# Patient Record
Sex: Female | Born: 1955 | Race: White | Hispanic: No | Marital: Married | Smoking: Current every day smoker
Health system: Southern US, Community
[De-identification: ages and names within clinical notes are randomized; demographics above are authoritative.]

## PROBLEM LIST (undated history)

## (undated) HISTORY — PX: OTHER SURGICAL HISTORY: SHX169

## (undated) HISTORY — PX: BREAST SURGERY: SHX581

## (undated) HISTORY — PX: TOTAL ABDOMINAL HYSTERECTOMY W/ BILATERAL SALPINGOOPHORECTOMY: SHX83

## (undated) HISTORY — PX: FOOT SURGERY: SHX648

## (undated) HISTORY — PX: GANGLION CYST EXCISION: SHX1691

## (undated) HISTORY — PX: CARPAL TUNNEL RELEASE: SHX101

---

## 1979-11-17 HISTORY — PX: TUBAL LIGATION: SHX77

## 2010-12-18 HISTORY — PX: BACK SURGERY: SHX140

## 2011-04-16 ENCOUNTER — Ambulatory Visit (INDEPENDENT_AMBULATORY_CARE_PROVIDER_SITE_OTHER): Payer: PRIVATE HEALTH INSURANCE | Admitting: Family Medicine

## 2011-04-16 ENCOUNTER — Encounter: Payer: Self-pay | Admitting: Family Medicine

## 2011-04-16 DIAGNOSIS — G4700 Insomnia, unspecified: Secondary | ICD-10-CM

## 2011-04-16 DIAGNOSIS — F17200 Nicotine dependence, unspecified, uncomplicated: Secondary | ICD-10-CM

## 2011-04-16 DIAGNOSIS — M545 Low back pain: Secondary | ICD-10-CM | POA: Insufficient documentation

## 2011-04-16 DIAGNOSIS — G8929 Other chronic pain: Secondary | ICD-10-CM

## 2011-04-16 DIAGNOSIS — F329 Major depressive disorder, single episode, unspecified: Secondary | ICD-10-CM

## 2011-04-16 DIAGNOSIS — Z23 Encounter for immunization: Secondary | ICD-10-CM

## 2011-04-16 DIAGNOSIS — M797 Fibromyalgia: Secondary | ICD-10-CM | POA: Insufficient documentation

## 2011-04-16 DIAGNOSIS — IMO0001 Reserved for inherently not codable concepts without codable children: Secondary | ICD-10-CM

## 2011-04-16 DIAGNOSIS — F32A Depression, unspecified: Secondary | ICD-10-CM

## 2011-04-16 DIAGNOSIS — Z720 Tobacco use: Secondary | ICD-10-CM

## 2011-04-16 NOTE — Assessment & Plan Note (Signed)
She is not interested in cessation at this time.

## 2011-04-16 NOTE — Assessment & Plan Note (Signed)
She cas call when she is due for hre tramadol. Would like to get her old records.

## 2011-04-16 NOTE — Assessment & Plan Note (Signed)
Well-controlled on current regimen. ?

## 2011-04-16 NOTE — Progress Notes (Signed)
Subjective:    Patient ID: Michelle Rodgers, female    DOB: 13-Jul-1956, 55 y.o.   MRN: [id number]  HPI Here to establish care.  Relocated her last fall. REcently had back surgery with Dr. Winferd Humphrey at [facility]. She is doing much better and has actually come off her narcotics int the last month.  She does have her tramadol for prn use for her firbromyalgia but she says she tends to do better in the summer so she is hoping she won't need it anytime soon.    She feels her mood is well controlled on her current regimen.  She doesn't need refills right now.    Review of Systems  Constitutional: Negative for fever, diaphoresis and unexpected weight change.  HENT: Negative for hearing loss, rhinorrhea and tinnitus.   Eyes: Negative for visual disturbance.  Respiratory: Negative for cough and wheezing.   Cardiovascular: Negative for chest pain and palpitations.  Gastrointestinal: Negative for nausea, vomiting, diarrhea and blood in stool.  Genitourinary: Negative for vaginal bleeding, vaginal discharge and difficulty urinating.  Musculoskeletal: Negative for myalgias and arthralgias.  Skin: Negative for rash.  Neurological: Negative for headaches.  Hematological: Negative for adenopathy. Does not bruise/bleed easily.  Psychiatric/Behavioral: Negative for sleep disturbance and dysphoric mood. The patient is not nervous/anxious.     BP 122/75  Pulse 86  Ht 5\' 4"  (1.626 m)  Wt 282 lb (127.914 kg)  BMI 48.41 kg/m2    No Known Allergies  History reviewed. No pertinent past medical history.  Past Surgical History  Procedure Date  . Cesarean section 1978  . Tubal ligation 1981  . Hysterec   . Total abdominal hysterectomy w/ bilateral salpingoophorectomy   . Carpal tunnel release     Left and right  . Foot surgery     Right  . Breast surgery     Left   . Ganglion cyst excision     Left  . Back surgery 12/18/2010  . Pilonidal cyst     History   Social History  . Marital Status: Married      Spouse Name: Marcy Salvo    Number of Children: 1   . Years of Education: N/A   Occupational History  . Disabled.     Social History Main Topics  . Smoking status: Current Everyday Smoker -- 1.0 packs/day for 30 years    Types: Cigarettes  . Smokeless tobacco: Not on file  . Alcohol Use: No  . Drug Use: No  . Sexually Active: Not on file   Other Topics Concern  . Not on file   Social History Narrative   Disabled for her back pain and her fibromyalgia.  GED. 1 caffeinated drink per day.     Family History  Problem Relation Age of Onset  . Heart attack Mother 51    Bypass, stents  . Diabetes Mother   . Hypertension Mother   . COPD Mother     Current outpatient prescriptions:escitalopram (LEXAPRO) 20 MG tablet, Take 20 mg by mouth daily.  , Disp: , Rfl: ;  oxyCODONE-acetaminophen (PERCOCET) 10-325 MG per tablet, Take 1 tablet by mouth every 4 (four) hours as needed.  , Disp: , Rfl: ;  pregabalin (LYRICA) 75 MG capsule, Take 75 mg by mouth 3 (three) times daily.  , Disp: , Rfl: ;  traZODone (DESYREL) 100 MG tablet, Take 100 mg by mouth. Take two at bedtime  , Disp: , Rfl:  Venlafaxine HCl 37.5 MG TB24, Take by mouth 2 (two)  times daily.  , Disp: , Rfl:      Objective:   Physical Exam  Constitutional: She is oriented to person, place, and time. She appears well-developed.  HENT:  Head: Normocephalic and atraumatic.  Right Ear: External ear normal.  Left Ear: External ear normal.  Eyes: Conjunctivae are normal.  Neck: Neck supple. No thyromegaly present.  Cardiovascular: Regular rhythm and normal heart sounds.        No carotid bruits.   Pulmonary/Chest: Effort normal and breath sounds normal.  Musculoskeletal: She exhibits no edema.  Lymphadenopathy:    She has no cervical adenopathy.  Neurological: She is alert and oriented to person, place, and time.  Skin: Skin is warm and dry.  Psychiatric: She has a normal mood and affect.          Assessment & Plan:   Discussed vaccines. She is overdue for Tdap. Given today.

## 2011-04-17 ENCOUNTER — Telehealth: Payer: Self-pay | Admitting: *Deleted

## 2011-04-17 MED ORDER — TRAMADOL HCL 50 MG PO TABS: 50.0000 mg | ORAL_TABLET | Freq: Two times a day (BID) | ORAL | Status: DC | PRN

## 2011-04-17 NOTE — Telephone Encounter (Signed)
Pt called and wants a refill of her tramadol.she was a new pt that was seen yesterday

## 2011-05-12 ENCOUNTER — Other Ambulatory Visit: Payer: Self-pay | Admitting: Family Medicine

## 2011-05-12 MED ORDER — PREGABALIN 75 MG PO CAPS: 75.0000 mg | ORAL_CAPSULE | Freq: Three times a day (TID) | ORAL | Status: DC

## 2011-05-13 NOTE — Telephone Encounter (Signed)
Closed

## 2011-05-14 ENCOUNTER — Ambulatory Visit (INDEPENDENT_AMBULATORY_CARE_PROVIDER_SITE_OTHER): Payer: PRIVATE HEALTH INSURANCE | Admitting: Family Medicine

## 2011-05-14 ENCOUNTER — Encounter: Payer: Self-pay | Admitting: Family Medicine

## 2011-05-14 VITALS — BP 137/93 | HR 101 | Temp 98.3°F | Ht 65.0 in | Wt 281.0 lb

## 2011-05-14 DIAGNOSIS — IMO0001 Reserved for inherently not codable concepts without codable children: Secondary | ICD-10-CM

## 2011-05-14 DIAGNOSIS — G8929 Other chronic pain: Secondary | ICD-10-CM

## 2011-05-14 DIAGNOSIS — R109 Unspecified abdominal pain: Secondary | ICD-10-CM

## 2011-05-14 DIAGNOSIS — R350 Frequency of micturition: Secondary | ICD-10-CM

## 2011-05-14 LAB — POCT URINALYSIS DIPSTICK
Glucose, UA: NEGATIVE
Nitrite, UA: NEGATIVE

## 2011-05-14 MED ORDER — CARISOPRODOL 350 MG PO TABS: 350.0000 mg | ORAL_TABLET | Freq: Three times a day (TID) | ORAL | Status: AC | PRN

## 2011-05-14 MED ORDER — IBUPROFEN 800 MG PO TABS: 800.0000 mg | ORAL_TABLET | Freq: Three times a day (TID) | ORAL | Status: AC | PRN

## 2011-05-14 NOTE — Patient Instructions (Addendum)
Will call you with urine culture result on Monday and will treat only if + for infection.  Will change your Flexeril to [facility] which you can take 3 x a day for muscle spasm.    Use RX Ibuprofen 800 mg 3 x a day with food for pain.  Return for f/u chronic pain with Dr Linford Arnold in 3 wks.

## 2011-05-14 NOTE — Assessment & Plan Note (Signed)
UA is neg today, so this is likely MDK

## 2011-05-14 NOTE — Progress Notes (Signed)
  Subjective:    Patient ID: Michelle Rodgers, female    DOB: 03-01-1956, 55 y.o.   MRN: [id number]  HPI  55 yo WF presents for pain in the L flank that started last wk.  She saw her neurosurgeon and was given methylprednisolone but it did not help.  She has chronic pain issues.  She has nocturia but no increased frequency during the day.  No dysuria.  She denies any suprapubic pain.  She drinks a lot of fluid during the night.  Denies hematuria.    She sees Dr Winferd Humphrey for her herniated discs.  She is requesting to restart chronic narcotics for her pain.  BP 137/93  Pulse 101  Temp(Src) 98.3 F (36.8 C) (Oral)  Ht 5\' 5"  (1.651 m)  Wt 281 lb (127.461 kg)  BMI 46.76 kg/m2    Review of Systems  Constitutional: Negative for fatigue.  Gastrointestinal: Negative for abdominal pain.  Genitourinary: Positive for frequency and enuresis. Negative for dysuria, urgency and hematuria.  Musculoskeletal: Positive for myalgias, back pain and arthralgias. Negative for joint swelling.       Objective:   Physical Exam  Constitutional: She appears well-developed and well-nourished.       Obese, in NAD  HENT:  Mouth/Throat: Oropharynx is clear and moist.  Eyes: No scleral icterus.  Neck: Neck supple.  Cardiovascular: Normal rate, regular rhythm and normal heart sounds.   Pulmonary/Chest: Effort normal and breath sounds normal. No respiratory distress.  Abdominal: Soft. There is no tenderness.  Musculoskeletal: She exhibits no edema.       Tender over L rhomboids  Skin: Skin is warm and dry.  Psychiatric: She has a normal mood and affect.          Assessment & Plan:  Flank Pain- likely MSK.  UA neg today but will send for cx.    Chronic Pain-. Dr Linford Arnold is her PCP and I reviewed her last OV note which did not indicate a plan to RF her narcotics.  I explained that she may need to see pain management.  She will make a f/u in 3 wks.

## 2011-05-17 LAB — URINE CULTURE

## 2011-05-18 ENCOUNTER — Telehealth: Payer: Self-pay | Admitting: Family Medicine

## 2011-05-18 NOTE — Telephone Encounter (Signed)
Pls let pt know that her urine culture came back negative indicating no UTI.  Her flank pain was likely to be musculoskeletal.

## 2011-05-18 NOTE — Telephone Encounter (Signed)
Pt advised of results and rec. 

## 2011-06-05 ENCOUNTER — Ambulatory Visit (INDEPENDENT_AMBULATORY_CARE_PROVIDER_SITE_OTHER): Payer: PRIVATE HEALTH INSURANCE | Admitting: Family Medicine

## 2011-06-05 ENCOUNTER — Encounter: Payer: Self-pay | Admitting: Family Medicine

## 2011-06-05 DIAGNOSIS — M549 Dorsalgia, unspecified: Secondary | ICD-10-CM

## 2011-06-05 DIAGNOSIS — R030 Elevated blood-pressure reading, without diagnosis of hypertension: Secondary | ICD-10-CM

## 2011-06-05 MED ORDER — OXYCODONE-ACETAMINOPHEN 10-325 MG PO TABS: 1.0000 | ORAL_TABLET | Freq: Three times a day (TID) | ORAL | Status: DC | PRN

## 2011-06-05 NOTE — Patient Instructions (Signed)
Blood pressure check in a few weeks.

## 2011-06-05 NOTE — Progress Notes (Signed)
  Subjective:    Patient ID: Britiney Blahnik, female    DOB: [birth year], 55 y.o.   MRN: [id number]  HPI Called her neurosurgeon for her back (put on prednisone) but that didn't help.  Saw Dr. Cathey Endow who gave her soma and NSAID but says that didn't really help.  She would like to restart her pecocet. She really wanted to wean off but still having significant pain. Having pain turning over in bed at night.  Also tried alave and no help. Has been doing pull exercises.   Also starting beginners yoga. Says initially after her back surgery she had not pain but over the last month has pain is back and she says it is just as painful before her surgery.   Has f/u with her neurosurgeon on August on 8th.     Review of Systems     Objective:   Physical Exam  Constitutional: She appears well-developed and well-nourished.  Cardiovascular: Normal rate, regular rhythm and normal heart sounds.   Pulmonary/Chest: Effort normal and breath sounds normal.  Musculoskeletal: She exhibits no edema.       Neg straight leg raise. Hip, knee, and ankle strength 5/5 bilat.    Skin: Skin is warm and dry.  Psychiatric: She has a normal mood and affect.          Assessment & Plan:  BAck pain - chronic, discussed that I will give her 40 tabs until hre appt in 2 weeks, but I need a note from her surgeon about what he recommends for her pain control for her back. Her former PCP wrote her pain meds. I Am OK to write. Will need a narcotic contract once a get letter or note from the surgeon.  Elevated BP- has never been high before. Recheck in a couple of weeks. She feels it is from the pain.

## 2011-06-15 ENCOUNTER — Other Ambulatory Visit: Payer: Self-pay | Admitting: *Deleted

## 2011-06-15 MED ORDER — PREGABALIN 75 MG PO CAPS: 75.0000 mg | ORAL_CAPSULE | Freq: Three times a day (TID) | ORAL | Status: DC

## 2011-07-02 ENCOUNTER — Encounter: Payer: Self-pay | Admitting: Family Medicine

## 2011-07-02 ENCOUNTER — Ambulatory Visit (INDEPENDENT_AMBULATORY_CARE_PROVIDER_SITE_OTHER): Payer: PRIVATE HEALTH INSURANCE | Admitting: Family Medicine

## 2011-07-02 DIAGNOSIS — M25562 Pain in left knee: Secondary | ICD-10-CM

## 2011-07-02 DIAGNOSIS — N281 Cyst of kidney, acquired: Secondary | ICD-10-CM

## 2011-07-02 DIAGNOSIS — M25569 Pain in unspecified knee: Secondary | ICD-10-CM

## 2011-07-02 DIAGNOSIS — Q619 Cystic kidney disease, unspecified: Secondary | ICD-10-CM

## 2011-07-02 NOTE — Patient Instructions (Signed)
i will call you once a get a copy of your results from your MRI.

## 2011-07-02 NOTE — Progress Notes (Signed)
  Subjective:    Patient ID: Michelle Rodgers, female    DOB: Aug 26, 1956, 55 y.o.   MRN: [id number]  HPI Dr. Winferd Humphrey did an MRI (that is her ortho). She was seeing him bc hx of pior back surgery and was having recurrent pain in her low back. Saw 2 cysts in the left kidney.   Left lateral knee pain. When moves her knee it feels like a "slushy" . Pain for at least 6 weeks. Worse with getting up and down.  Not giving out.  No locking.  No sitffness.  Has been doing exercises in the pool.  After walking for a few minutes it eases off.    Review of Systems     Objective:   Physical Exam  Constitutional: She appears well-developed and well-nourished.  HENT:  Head: Normocephalic and atraumatic.  Cardiovascular: Normal rate, regular rhythm and normal heart sounds.   Pulmonary/Chest: Effort normal and breath sounds normal.  Musculoskeletal:       LT knee with NROM, no pain with extension or flexion.  No laxity of hte joint. No swelling. Non tender over the medal jont lines but tender at the base of the patella.    Skin: Skin is warm and dry.  Psychiatric: She has a normal mood and affect. Her behavior is normal.          Assessment & Plan:  LT lat knee pain - Consider iliotibial band syndrome. Given H.O. On exercises. Tx with NSAID prn.  Call if not better in about 3 week and will refer to ortho.   Kidney cysts - will need to get copy of report to see what type of cysts. Explained to pt thi helps determine the  Best course of action. I expalined that these should not be causing pain. If continue to have back pain consider PT>

## 2011-07-13 ENCOUNTER — Encounter: Payer: Self-pay | Admitting: Family Medicine

## 2011-07-14 ENCOUNTER — Telehealth: Payer: Self-pay | Admitting: Family Medicine

## 2011-07-14 DIAGNOSIS — N281 Cyst of kidney, acquired: Secondary | ICD-10-CM | POA: Insufficient documentation

## 2011-07-14 NOTE — Telephone Encounter (Signed)
Call pt: I got copy of MRI from [facility] on the 2 cysts on the Left kidney. These evidently were stable compared to an MRI done on 09/2010 which was about 9 months ago. This is good new that they haven't changed in almost a year. Recommend repeat US to eval in 1 year to make sure stable.

## 2011-07-15 NOTE — Telephone Encounter (Signed)
Left message on pt.'s vm.

## 2011-07-21 ENCOUNTER — Other Ambulatory Visit: Payer: Self-pay | Admitting: *Deleted

## 2011-07-21 MED ORDER — PREGABALIN 75 MG PO CAPS: 75.0000 mg | ORAL_CAPSULE | Freq: Three times a day (TID) | ORAL | Status: DC

## 2011-07-22 ENCOUNTER — Other Ambulatory Visit: Payer: Self-pay | Admitting: *Deleted

## 2011-07-22 MED ORDER — PREGABALIN 75 MG PO CAPS: 75.0000 mg | ORAL_CAPSULE | Freq: Three times a day (TID) | ORAL | Status: DC

## 2011-07-23 ENCOUNTER — Other Ambulatory Visit: Payer: Self-pay | Admitting: Family Medicine

## 2011-07-23 NOTE — Telephone Encounter (Signed)
[facility] calling and stating they needed hard copy script for Lyrica faxed to [phone]. Plan:  Called the pharmacy, and the hard script has been already taken care of. Jarvis Newcomer, LPN [facility]

## 2011-11-27 DIAGNOSIS — Z9889 Other specified postprocedural states: Secondary | ICD-10-CM | POA: Insufficient documentation

## 2012-01-09 HISTORY — PX: CERVICAL DISC SURGERY: SHX588

## 2012-01-24 ENCOUNTER — Encounter: Payer: Self-pay | Admitting: Family Medicine

## 2012-06-16 ENCOUNTER — Ambulatory Visit (INDEPENDENT_AMBULATORY_CARE_PROVIDER_SITE_OTHER): Payer: PRIVATE HEALTH INSURANCE

## 2012-06-16 ENCOUNTER — Ambulatory Visit (INDEPENDENT_AMBULATORY_CARE_PROVIDER_SITE_OTHER): Payer: PRIVATE HEALTH INSURANCE | Admitting: Family Medicine

## 2012-06-16 ENCOUNTER — Other Ambulatory Visit: Payer: Self-pay | Admitting: Family Medicine

## 2012-06-16 ENCOUNTER — Encounter: Payer: Self-pay | Admitting: Family Medicine

## 2012-06-16 VITALS — BP 144/79 | HR 84 | Temp 98.4°F | Ht 65.0 in | Wt 268.0 lb

## 2012-06-16 DIAGNOSIS — R030 Elevated blood-pressure reading, without diagnosis of hypertension: Secondary | ICD-10-CM

## 2012-06-16 DIAGNOSIS — M25569 Pain in unspecified knee: Secondary | ICD-10-CM

## 2012-06-16 DIAGNOSIS — M549 Dorsalgia, unspecified: Secondary | ICD-10-CM

## 2012-06-16 DIAGNOSIS — M25469 Effusion, unspecified knee: Secondary | ICD-10-CM

## 2012-06-16 DIAGNOSIS — M797 Fibromyalgia: Secondary | ICD-10-CM

## 2012-06-16 DIAGNOSIS — M1710 Unilateral primary osteoarthritis, unspecified knee: Secondary | ICD-10-CM

## 2012-06-16 DIAGNOSIS — M25562 Pain in left knee: Secondary | ICD-10-CM

## 2012-06-16 DIAGNOSIS — IMO0002 Reserved for concepts with insufficient information to code with codable children: Secondary | ICD-10-CM

## 2012-06-16 DIAGNOSIS — IMO0001 Reserved for inherently not codable concepts without codable children: Secondary | ICD-10-CM

## 2012-06-16 MED ORDER — HYDROCODONE-ACETAMINOPHEN 10-325 MG PO TABS: 1.0000 | ORAL_TABLET | Freq: Two times a day (BID) | ORAL | Status: AC | PRN

## 2012-06-16 NOTE — Progress Notes (Signed)
Subjective:    Patient ID: Michelle Rodgers, female    DOB: 12-Oct-1956, 56 y.o.   MRN: [id number]  HPI Still having pain in the left knee posterior lateral area.  Says was using pain meds for her back so now that she is off those she is noticing it more.  Worse when sits for awhile and then gets up.  No locking or popping or giving out.  No swelling. Has been taking 8 Aleve a day for pain control.  Had an xray of that knee about 5 years and told had some OA. She has had her back surgery since I last saw her. She has done well with this. The numbness that was chronic in her left leg is resolved but she does have some residual weakness. She said she did do physical therapy for this. Her follow with her surgeon is next month.   Review of Systems BP 144/79  Pulse 84  Temp 98.4 F (36.9 C) (Oral)  Ht 5\' 5"  (1.651 m)  Wt 268 lb (121.564 kg)  BMI 44.60 kg/m2  SpO2 97%    No Known Allergies  No past medical history on file.  Past Surgical History  Procedure Date  . Cesarean section 1978  . Tubal ligation 1981  . Hysterec   . Total abdominal hysterectomy w/ bilateral salpingoophorectomy   . Carpal tunnel release     Left and right  . Foot surgery     Right  . Breast surgery     Left   . Ganglion cyst excision     Left  . Back surgery 12/18/2010    L4-5 post lumbar interbody fusion by Dr. 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 Pilonidal cyst   . Cervical disc surgery 01/09/12    C4-C7    History   Social History  . Marital Status: Married    Spouse Name: Marcy Salvo    Number of Children: 1   . Years of Education: N/A   Occupational History  . Disabled.     Social History Main Topics  . Smoking status: Current Everyday Smoker -- 1.0 packs/day for 30 years    Types: Cigarettes  . Smokeless tobacco: Not on file  . Alcohol Use: No  . Drug Use: No  . Sexually Active: Not on file   Other Topics Concern  . Not on file   Social History Narrative   Disabled for her back  pain and her fibromyalgia.  GED. 1 caffeinated drink per day.     Family History  Problem Relation Age of Onset  . Heart attack Mother 32    Bypass, stents  . Diabetes Mother   . Hypertension Mother   . COPD Mother     Outpatient Encounter Prescriptions as of 06/16/2012  Medication Sig Dispense Refill  . escitalopram (LEXAPRO) 20 MG tablet Take 20 mg by mouth daily.        . traZODone (DESYREL) 100 MG tablet Take 200 mg by mouth at bedtime. Take two at bedtime       . Venlafaxine HCl 37.5 MG TB24 Take by mouth 2 (two) times daily.        Marland Kitchen HYDROcodone-acetaminophen (NORCO) 10-325 MG per tablet Take 1 tablet by mouth 2 (two) times daily as needed for pain.  60 tablet  0  . DISCONTD: carisoprodol (SOMA) 350 MG tablet Take 350 mg by mouth 3 (three) times daily as needed.        Marland Kitchen DISCONTD: oxyCODONE-acetaminophen (PERCOCET)  10-325 MG per tablet Take 1 tablet by mouth every 8 (eight) hours as needed.  40 tablet  0  . DISCONTD: pregabalin (LYRICA) 75 MG capsule Take 1 capsule (75 mg total) by mouth 3 (three) times daily.  90 capsule  0  . DISCONTD: traMADol (ULTRAM) 50 MG tablet Take 1 tablet (50 mg total) by mouth 2 (two) times daily between meals as needed.  60 tablet  0           Objective:   Physical Exam  Constitutional: She is oriented to person, place, and time. She appears well-developed and well-nourished.  Musculoskeletal:       LEFT knee with normal range of motion. No crepitus. She is nontender along the joint lines. She is tender over the posterior a lateral edge. No apparent swelling. No tenderness around the knee cap. Strength is 4/5 in the LEFT hip, knee and ankle. Strength is 5 out of 5 on the right leg. No increased laxity. No significant pain with varus or valgus stress.  Neurological: She is alert and oriented to person, place, and time.  Skin: Skin is warm and dry.  Psychiatric: She has a normal mood and affect. Her behavior is normal.          Assessment &  Plan:  Left knee pain - Will start with the xray. Since her pain has been present for well over a year. Initially when I saw her a year ago that was more of a tendinitis and work on stretches but she did not improve. She's had her back surgery since then so it should not be related to this. The she does have some chronic decreased strength in his leg that will likely not improve. She's even done physical therapy. Depending on the x-ray results consider repeat physical therapy versus referral to an orthopedist versus getting an MRI. She can continue Aleve as needed with food and water, but needs to dec her total tabs per day. Stop immediately if any GI irritation. I didn't send over a prescription for hydrocodone to use up to twice a day #60 tabs, thus can reduce her intake of Aleve.   Fibromyalgia/chronic pain-she's requesting referral to a pain clinic. She does still have some chronic back pain even if she's had surgery and has done well. She also has chronic pain for her fibromyalgia. I be happy to refer her. She does live here in [place].  Elevated BP - Had 2 cups of coffe and on NSAID, and smoker.  She says she checks her blood pressure frequently at home. Yesterday it ran 120/70.

## 2012-06-16 NOTE — Patient Instructions (Addendum)
Ice as needed Rest and elevated as needed Can use Aleve with food and water to avoid Stomach irritation.

## 2012-06-18 ENCOUNTER — Ambulatory Visit (HOSPITAL_BASED_OUTPATIENT_CLINIC_OR_DEPARTMENT_OTHER)
Admission: RE | Admit: 2012-06-18 | Discharge: 2012-06-18 | Disposition: A | Discharge status: Home / Self Care | Payer: PRIVATE HEALTH INSURANCE | Source: Ambulatory Visit | Attending: Family Medicine | Admitting: Family Medicine

## 2012-06-18 DIAGNOSIS — M1710 Unilateral primary osteoarthritis, unspecified knee: Secondary | ICD-10-CM | POA: Insufficient documentation

## 2012-06-18 DIAGNOSIS — IMO0002 Reserved for concepts with insufficient information to code with codable children: Secondary | ICD-10-CM | POA: Insufficient documentation

## 2012-06-18 DIAGNOSIS — E669 Obesity, unspecified: Secondary | ICD-10-CM | POA: Insufficient documentation

## 2012-06-18 DIAGNOSIS — M7120 Synovial cyst of popliteal space [Baker], unspecified knee: Secondary | ICD-10-CM | POA: Insufficient documentation

## 2012-06-18 DIAGNOSIS — X58XXXA Exposure to other specified factors, initial encounter: Secondary | ICD-10-CM | POA: Insufficient documentation

## 2012-06-18 DIAGNOSIS — M25569 Pain in unspecified knee: Secondary | ICD-10-CM | POA: Insufficient documentation

## 2012-06-18 DIAGNOSIS — M25469 Effusion, unspecified knee: Secondary | ICD-10-CM | POA: Insufficient documentation

## 2012-06-18 DIAGNOSIS — M25562 Pain in left knee: Secondary | ICD-10-CM

## 2012-06-21 ENCOUNTER — Telehealth: Payer: Self-pay | Admitting: *Deleted

## 2012-06-21 DIAGNOSIS — S83209A Unspecified tear of unspecified meniscus, current injury, unspecified knee, initial encounter: Secondary | ICD-10-CM

## 2012-06-21 DIAGNOSIS — M25569 Pain in unspecified knee: Secondary | ICD-10-CM

## 2012-06-21 NOTE — Telephone Encounter (Signed)
Referral placed.

## 2012-06-21 NOTE — Telephone Encounter (Signed)
Pt states the doctor she wants to see in regards to her xr is Kennon Holter at [facility]

## 2012-07-20 DIAGNOSIS — Z981 Arthrodesis status: Secondary | ICD-10-CM | POA: Insufficient documentation

## 2012-08-05 ENCOUNTER — Encounter: Payer: Self-pay | Admitting: Physician Assistant

## 2012-08-05 ENCOUNTER — Ambulatory Visit (INDEPENDENT_AMBULATORY_CARE_PROVIDER_SITE_OTHER): Payer: PRIVATE HEALTH INSURANCE | Admitting: Physician Assistant

## 2012-08-05 VITALS — BP 145/83 | HR 93 | Temp 98.1°F | Ht 65.0 in | Wt 271.0 lb

## 2012-08-05 DIAGNOSIS — J040 Acute laryngitis: Secondary | ICD-10-CM

## 2012-08-05 DIAGNOSIS — J069 Acute upper respiratory infection, unspecified: Secondary | ICD-10-CM

## 2012-08-05 MED ORDER — ALBUTEROL SULFATE HFA 108 (90 BASE) MCG/ACT IN AERS: 2.0000 | INHALATION_SPRAY | Freq: Four times a day (QID) | RESPIRATORY_TRACT | Status: DC | PRN

## 2012-08-05 NOTE — Patient Instructions (Addendum)
Vitamin C and Zinc for immune system. Honey for sore throat and cough. Motrin 400mg  2-3 times a day. Albuterol inhaler every 4-6 hours.   Laryngitis At the top of your windpipe is your voice box. It is the source of your voice. Inside your voice box are 2 bands of muscles called vocal cords. When you breathe, your vocal cords are relaxed and open so that air can get into the lungs. When you decide to say something, these cords come together and vibrate. The sound from these vibrations goes into your throat and comes out through your mouth as sound. Laryngitis is an inflammation of the vocal cords that causes hoarseness, cough, loss of voice, sore throat, and dry throat. Laryngitis can be temporary (acute) or long-term (chronic). Most cases of acute laryngitis improve with time.Chronic laryngitis lasts for more than 3 weeks. CAUSES Laryngitis can often be related to excessive smoking, talking, or yelling, as well as inhalation of toxic fumes and allergies. Acute laryngitis is usually caused by a viral infection, vocal strain, measles or mumps, or bacterial infections. Chronic laryngitis is usually caused by vocal cord strain, vocal cord injury, postnasal drip, growths on the vocal cords, or acid reflux. SYMPTOMS   Cough.   Sore throat.   Dry throat.  RISK FACTORS  Respiratory infections.   Exposure to irritating substances, such as cigarette smoke, excessive amounts of alcohol, stomach acids, and workplace chemicals.   Voice trauma, such as vocal cord injury from shouting or speaking too loud.  DIAGNOSIS  Your cargiver will perform a physical exam. During the physical exam, your caregiver will examine your throat. The most common sign of laryngitis is hoarseness. Laryngoscopy may be necessary to confirm the diagnosis of this condition. This procedure allows your caregiver to look into the larynx. HOME CARE INSTRUCTIONS  Drink enough fluids to keep your urine clear or pale yellow.   Rest  until you no longer have symptoms or as directed by your caregiver.   Breathe in moist air.   Take all medicine as directed by your caregiver.   Do not smoke.   Talk as little as possible (this includes whispering).   Write on paper instead of talking until your voice is back to normal.   Follow up with your caregiver if your condition has not improved after 10 days.  SEEK MEDICAL CARE IF:   You have trouble breathing.   You cough up blood.   You have persistent fever.   You have increasing pain.   You have difficulty swallowing.  MAKE SURE YOU:  Understand these instructions.   Will watch your condition.   Will get help right away if you are not doing well or get worse.  Document Released: 11/02/2005 Document Revised: 10/22/2011 Document Reviewed: 01/08/2011 [facility] Patient Information 2012 [place], [place].

## 2012-08-05 NOTE — Progress Notes (Signed)
  Subjective:    Patient ID: Michelle Rodgers, female    DOB: 14-Jul-1956, 56 y.o.   MRN: [id number]  HPI  Patient presents to clinic with hoarseness, sore throat, and bilateral ear pain that started yesterday. She wanted to be seen before the weekend. She has not tried anything to make better and she feels like she continues to get worse. She cannot talk at all. Hx of laryngitis with infections. She has felt hot but not taking temperature. She has non productive cough and ear pain with no discharge. She has felt SOB.     Review of Systems     Objective:   Physical Exam  Constitutional: She is oriented to person, place, and time. She appears well-developed and well-nourished.  HENT:  Head: Normocephalic and atraumatic.  Right Ear: External ear normal.  Left Ear: External ear normal.  Nose: Nose normal.  Mouth/Throat: Oropharynx is clear and moist. No oropharyngeal exudate.  Eyes: Conjunctivae normal are normal.  Neck: Normal range of motion. Neck supple.  Cardiovascular: Normal rate, regular rhythm and normal heart sounds.   Pulmonary/Chest: Effort normal and breath sounds normal. She has no wheezes.  Lymphadenopathy:    She has no cervical adenopathy.  Neurological: She is alert and oriented to person, place, and time.  Skin: Skin is warm and dry.  Psychiatric: She has a normal mood and affect. Her behavior is normal.          Assessment & Plan:  URI/laryngitis/SOB- Vitamin C and Zinc for immune system. Honey for sore throat and cough. Motrin 400mg  2-3 times a day. Albuterol inhaler every 4-6 hours. I suspect viral. If SOB gets worse or cough increasing give office a call. Will consider X-ray if persist. Gave H.O. On laryngitis. Rest voice as much as possible.

## 2012-11-25 ENCOUNTER — Encounter: Payer: Self-pay | Admitting: Family Medicine

## 2012-11-25 ENCOUNTER — Ambulatory Visit (INDEPENDENT_AMBULATORY_CARE_PROVIDER_SITE_OTHER): Payer: PRIVATE HEALTH INSURANCE | Admitting: Family Medicine

## 2012-11-25 ENCOUNTER — Encounter: Payer: Self-pay | Admitting: Sports Medicine

## 2012-11-25 ENCOUNTER — Ambulatory Visit (INDEPENDENT_AMBULATORY_CARE_PROVIDER_SITE_OTHER): Payer: PRIVATE HEALTH INSURANCE | Admitting: Sports Medicine

## 2012-11-25 VITALS — BP 135/82 | HR 87 | Wt 283.0 lb

## 2012-11-25 DIAGNOSIS — M1710 Unilateral primary osteoarthritis, unspecified knee: Secondary | ICD-10-CM

## 2012-11-25 DIAGNOSIS — IMO0001 Reserved for inherently not codable concepts without codable children: Secondary | ICD-10-CM

## 2012-11-25 DIAGNOSIS — R61 Generalized hyperhidrosis: Secondary | ICD-10-CM

## 2012-11-25 DIAGNOSIS — R635 Abnormal weight gain: Secondary | ICD-10-CM

## 2012-11-25 DIAGNOSIS — M1712 Unilateral primary osteoarthritis, left knee: Secondary | ICD-10-CM | POA: Insufficient documentation

## 2012-11-25 DIAGNOSIS — Z1231 Encounter for screening mammogram for malignant neoplasm of breast: Secondary | ICD-10-CM

## 2012-11-25 DIAGNOSIS — F329 Major depressive disorder, single episode, unspecified: Secondary | ICD-10-CM

## 2012-11-25 DIAGNOSIS — IMO0002 Reserved for concepts with insufficient information to code with codable children: Secondary | ICD-10-CM

## 2012-11-25 DIAGNOSIS — M25569 Pain in unspecified knee: Secondary | ICD-10-CM

## 2012-11-25 MED ORDER — BUPROPION HCL ER (XL) 150 MG PO TB24: 150.0000 mg | ORAL_TABLET | Freq: Every day | ORAL | Status: DC

## 2012-11-25 NOTE — Assessment & Plan Note (Signed)
Intra-articular steroid injection as above. I would like her to get her preapproved for Visco supplementation prior to considering knee replacement.

## 2012-11-25 NOTE — Progress Notes (Signed)
Procedure: Real-time Ultrasound Guided Injection of left knee Device: GE Logiq E  Ultrasound guided injection is preferred based studies that show increased duration, increased effect, greater accuracy, decreased procedural pain, increased response rate, and decreased cost with ultrasound guided versus blind injection.  Verbal informed consent obtained.  Time-out conducted.  Noted no overlying erythema, induration, or other signs of local infection.  Skin prepped in a sterile fashion.  Local anesthesia: Topical Ethyl chloride.  With sterile technique and under real time ultrasound guidance:  2 cc Kenalog 40, 4 cc lidocaine injected easily into the suprapatellar recess. Completed without difficulty  Pain immediately resolved suggesting accurate placement of the medication.  Advised to call if fevers/chills, erythema, induration, drainage, or persistent bleeding.  Images permanently stored and available for review in the ultrasound unit.  Impression: Technically successful ultrasound guided injection.

## 2012-11-25 NOTE — Progress Notes (Signed)
Subjective:    Patient ID: Michelle Rodgers, female    DOB: 03-Jan-1956, 57 y.o.   MRN: [id number]  HPI Depression-On Lexapro 20mg .  Has had several family members pass away this year.  Has been wanting to sleep too much. She doesn't ant ot interact with people and doesn't want to get out of the house.  She is teaful today.   Knee pain, left-she recently saw her orthopedist and had an injection. She was post followup for second injection but was sick and was unable to make it that she is requesting that we do her second injection today. Has been hard to walk and can't exercise.  Says hard to even bend her leg to put he pants on.     Review of Systems Has been sweating a lot over the last couple of months and has gained about 20 lbs in the last few months. She reports eating very few calories a day. Her weight also seems to fluctuate. She does skip meals. She has also quit smoking. She is interested in discussing weight loss medications.    Objective:   Physical Exam  Constitutional: She is oriented to person, place, and time. She appears well-developed and well-nourished.  HENT:  Head: Normocephalic and atraumatic.  Cardiovascular: Normal rate, regular rhythm and normal heart sounds.   Pulmonary/Chest: Effort normal and breath sounds normal.  Neurological: She is alert and oriented to person, place, and time.  Skin: Skin is warm and dry.  Psychiatric: She has a normal mood and affect. Her behavior is normal.          Assessment & Plan:  Follow up depression-PHQ 9 score of 20 today. Uncontrolled. She has been very tearful here in the office today. We discussed different options. We could certainly continue the Lexapro which she has been on for over 10 years and had something like Wellbutrin or maybe a mood stabilizer. There is an increased  risk with elevated blood sugars and weight gain with a mood stabilizer so she opted to try the Wellbutrin first. We discussed potential side effects.  Followup in 3-4 weeks to see how she's doing on this regimen. Also consider therapy in counseling.  Knee pain - Missed her appt in Dec bc of bronchitis but told ortho couldn't see her again until March. She would like to have a second injection. She was told that her arthritis was severe enough that she really needs knee replacement. They were hoping to schedule this sometime in March. I referred her to my partner Dr. Rodney Langton for knee injection today. Thirdly this will give her some more relief.  Tob abuse - She quit smoking for about a month. She is using the vapor cigarette.  Her husband has quit too.  I congratulated her on her success encouraged her to continue to work at. This is absolutely fantastic.  Because of her abnormal weight gain and sweats I would like to check her thyroid. If it's normal then consider possible treatment with weight loss medications and a nutritionist. Discussed that there are risks and benefits with the weight loss medications her blood pressure has been followed very carefully. It is also highly recommended that she is able to do an exercise program with the weight loss medications but she has not been able to work out because of her knee pain. We discussed attempting chair exercises.  She's also overdue for screening mammogram. Order placed today.  She's also overdue for physical and Pap smear. Encouraged her to  schedule this and the next month.

## 2012-11-25 NOTE — Patient Instructions (Signed)
We will add wellbutrin to your lexapro.

## 2012-11-26 LAB — COMPLETE METABOLIC PANEL WITH GFR
AST: 21 U/L (ref 0–37)
Albumin: 4.5 g/dL (ref 3.5–5.2)
Alkaline Phosphatase: 72 U/L (ref 39–117)
BUN: 16 mg/dL (ref 6–23)
Calcium: 10 mg/dL (ref 8.4–10.5)
Chloride: 98 mEq/L (ref 96–112)
Glucose, Bld: 88 mg/dL (ref 70–99)
Potassium: 4.1 mEq/L (ref 3.5–5.3)
Sodium: 137 mEq/L (ref 135–145)
Total Protein: 7.7 g/dL (ref 6.0–8.3)

## 2012-11-26 LAB — CBC WITH DIFFERENTIAL/PLATELET
Hemoglobin: 15.4 g/dL — ABNORMAL HIGH (ref 12.0–15.0)
Lymphocytes Relative: 38 % (ref 12–46)
Lymphs Abs: 3.1 10*3/uL (ref 0.7–4.0)
Monocytes Relative: 5 % (ref 3–12)
Neutrophils Relative %: 55 % (ref 43–77)
Platelets: 328 10*3/uL (ref 150–400)
RBC: 4.81 MIL/uL (ref 3.87–5.11)
WBC: 8.1 10*3/uL (ref 4.0–10.5)

## 2012-11-29 ENCOUNTER — Telehealth: Payer: Self-pay | Admitting: *Deleted

## 2012-11-29 NOTE — Telephone Encounter (Signed)
Long history of knee osteoarthritis, has had NSAIDS, acetaminophen, steroid injection.  Can send my last note.  Injections will be Supartz (sodium hyaluronate) 1 per week for 5 weeks as usual, Z6109 is the code, just state we are waiting for approval and havent' started the series yet.

## 2012-11-29 NOTE — Telephone Encounter (Signed)
I called pt's insurance and had to [facility] for the PA dept to call me back about PA for the Supartz injections. They are requesting clinical information with hx of disease, previous tx documentation. They are also requesting the series of injections with the dosage and the dates of the service. Please advise what information or dates of service you would like for me to fax to them. I will be faxing it to [phone]. Thanks.

## 2012-12-01 NOTE — Telephone Encounter (Signed)
I have received an approval number for pt's Supartz injections. Have called and informed pt she can schedule for 1st injection.

## 2012-12-07 ENCOUNTER — Ambulatory Visit: Payer: PRIVATE HEALTH INSURANCE | Admitting: Sports Medicine

## 2012-12-09 ENCOUNTER — Ambulatory Visit (INDEPENDENT_AMBULATORY_CARE_PROVIDER_SITE_OTHER): Payer: PRIVATE HEALTH INSURANCE | Admitting: Sports Medicine

## 2012-12-09 DIAGNOSIS — M1712 Unilateral primary osteoarthritis, left knee: Secondary | ICD-10-CM

## 2012-12-09 DIAGNOSIS — IMO0002 Reserved for concepts with insufficient information to code with codable children: Secondary | ICD-10-CM

## 2012-12-09 DIAGNOSIS — M1710 Unilateral primary osteoarthritis, unspecified knee: Secondary | ICD-10-CM

## 2012-12-09 NOTE — Assessment & Plan Note (Signed)
Supartz injection #1 of 5 today. Come back for #2.

## 2012-12-09 NOTE — Progress Notes (Addendum)
Michelle Rodgers has known osteoarthritis of her left knee. I recently performed an intra-articular steroid injection, she had excellent response. She is now ready to start Supartz series, despite continued pain relief. I think this is appropriate.   Procedure: Real-time Ultrasound Guided Injection of left knee Device: GE Logiq E  Ultrasound guided injection is preferred based studies that show increased duration, increased effect, greater accuracy, decreased procedural pain, increased response rate, and decreased cost with ultrasound guided versus blind injection.  Verbal informed consent obtained.  Time-out conducted.  Noted no overlying erythema, induration, or other signs of local infection.  Skin prepped in a sterile fashion.  Local anesthesia: Topical Ethyl chloride.  With sterile technique and under real time ultrasound guidance:  25 mg/2.5 mL of Supartz (sodium hyaluronate) in a prefilled syringe was injected easily into the knee through a 22-gauge needle.  Anatomy appeared somewhat distorted, due to body habitus. Completed without difficulty  Pain immediately resolved suggesting accurate placement of the medication.  Advised to call if fevers/chills, erythema, induration, drainage, or persistent bleeding.  Images permanently stored and available for review in the ultrasound unit.  Impression: Technically successful ultrasound guided injection.

## 2012-12-16 ENCOUNTER — Ambulatory Visit (INDEPENDENT_AMBULATORY_CARE_PROVIDER_SITE_OTHER): Payer: PRIVATE HEALTH INSURANCE | Admitting: Family Medicine

## 2012-12-16 ENCOUNTER — Encounter: Payer: Self-pay | Admitting: Family Medicine

## 2012-12-16 ENCOUNTER — Ambulatory Visit (INDEPENDENT_AMBULATORY_CARE_PROVIDER_SITE_OTHER): Payer: PRIVATE HEALTH INSURANCE | Admitting: Sports Medicine

## 2012-12-16 VITALS — BP 137/82 | HR 90 | Ht 65.0 in | Wt 268.0 lb

## 2012-12-16 DIAGNOSIS — F329 Major depressive disorder, single episode, unspecified: Secondary | ICD-10-CM

## 2012-12-16 DIAGNOSIS — M1710 Unilateral primary osteoarthritis, unspecified knee: Secondary | ICD-10-CM

## 2012-12-16 DIAGNOSIS — M1712 Unilateral primary osteoarthritis, left knee: Secondary | ICD-10-CM

## 2012-12-16 DIAGNOSIS — IMO0002 Reserved for concepts with insufficient information to code with codable children: Secondary | ICD-10-CM

## 2012-12-16 MED ORDER — VILAZODONE HCL 40 MG PO TABS: 40.0000 mg | ORAL_TABLET | Freq: Every day | ORAL | Status: DC

## 2012-12-16 MED ORDER — ESCITALOPRAM OXALATE 5 MG PO TABS: ORAL_TABLET | ORAL | Status: DC

## 2012-12-16 MED ORDER — TRAZODONE HCL 100 MG PO TABS: 200.0000 mg | ORAL_TABLET | Freq: Every day | ORAL | Status: DC

## 2012-12-16 NOTE — Patient Instructions (Addendum)
Decrease wellbutrin to every other day for 8 days, and then stop. Can start the viibryd once off the wellbutrin, in about 10 days.

## 2012-12-16 NOTE — Progress Notes (Signed)
Michelle Rodgers has known osteoarthritis of her left knee.   Procedure: Real-time Ultrasound Guided Injection of left knee Device: GE Logiq E  Ultrasound guided injection is preferred based studies that show increased duration, increased effect, greater accuracy, decreased procedural pain, increased response rate, and decreased cost with ultrasound guided versus blind injection.  Verbal informed consent obtained.  Time-out conducted.  Noted no overlying erythema, induration, or other signs of local infection.  Skin prepped in a sterile fashion.  Local anesthesia: Topical Ethyl chloride.  With sterile technique and under real time ultrasound guidance:  25 mg/2.5 mL of Supartz (sodium hyaluronate) in a prefilled syringe was injected easily into the knee through a 22-gauge needle.  Anatomy appeared somewhat distorted, due to body habitus. Completed without difficulty  Pain immediately resolved suggesting accurate placement of the medication.  Advised to call if fevers/chills, erythema, induration, drainage, or persistent bleeding.  Images permanently stored and available for review in the ultrasound unit.  Impression: Technically successful ultrasound guided injection.

## 2012-12-16 NOTE — Progress Notes (Signed)
  Subjective:    Patient ID: Michelle Rodgers, female    DOB: 01-08-1956, 57 y.o.   MRN: [id number]  HPI She is still struggling with her mood. She has been on Lexapro for almost 10 years and has done well until recently. She's comfortable or stressful things recently. We decided to add Wellbutrin. We also discussed adding a mood stabilizer if the Wellbutrin is helpful. She says she's only noticed a minimal difference in her mood. She is interested in discussing Viibryd. Her son started on this about 3 months ago and he feels like it is working very well for him. He was also previously on Lexapro before.   Review of Systems     Objective:   Physical Exam  Constitutional: She is oriented to person, place, and time. She appears well-developed and well-nourished.  HENT:  Head: Normocephalic and atraumatic.  Cardiovascular: Normal rate, regular rhythm and normal heart sounds.   Pulmonary/Chest: Effort normal and breath sounds normal.  Neurological: She is alert and oriented to person, place, and time.  Skin: Skin is warm and dry.  Psychiatric: She has a normal mood and affect. Her behavior is normal.          Assessment & Plan:  Depression-no major improvement since I last saw her. Her last PHQ 9 score was 20. We will wean off the Wellbutrin. Written taper given to patient. We will also wean her Lexapro and start Viibryd. She will follow back up in 6 weeks to make sure that she's doing well and tolerating it well.

## 2012-12-16 NOTE — Assessment & Plan Note (Signed)
Intra-articular Supartz injection #2 of 5 given today. Return in one week for #3.

## 2012-12-19 ENCOUNTER — Ambulatory Visit (INDEPENDENT_AMBULATORY_CARE_PROVIDER_SITE_OTHER): Payer: PRIVATE HEALTH INSURANCE

## 2012-12-19 DIAGNOSIS — Z1231 Encounter for screening mammogram for malignant neoplasm of breast: Secondary | ICD-10-CM

## 2012-12-20 ENCOUNTER — Ambulatory Visit: Payer: PRIVATE HEALTH INSURANCE

## 2012-12-23 ENCOUNTER — Ambulatory Visit (INDEPENDENT_AMBULATORY_CARE_PROVIDER_SITE_OTHER): Payer: PRIVATE HEALTH INSURANCE | Admitting: Sports Medicine

## 2012-12-23 DIAGNOSIS — M1710 Unilateral primary osteoarthritis, unspecified knee: Secondary | ICD-10-CM

## 2012-12-23 DIAGNOSIS — M1712 Unilateral primary osteoarthritis, left knee: Secondary | ICD-10-CM

## 2012-12-23 DIAGNOSIS — IMO0002 Reserved for concepts with insufficient information to code with codable children: Secondary | ICD-10-CM

## 2012-12-23 NOTE — Assessment & Plan Note (Addendum)
Intra-articular Supartz injection #3 of 5 given today. Return in one week for #4.  Her mother has knee osteoarthritis and she is thinking about bringing her in as well.

## 2012-12-23 NOTE — Progress Notes (Addendum)
Adriena has known osteoarthritis of her left knee.  Is now pain free.   Procedure: Real-time Ultrasound Guided Injection of left knee Device: GE Logiq E  Ultrasound guided injection is preferred based studies that show increased duration, increased effect, greater accuracy, decreased procedural pain, increased response rate, and decreased cost with ultrasound guided versus blind injection.  Verbal informed consent obtained.  Time-out conducted.  Noted no overlying erythema, induration, or other signs of local infection.  Skin prepped in a sterile fashion.  Local anesthesia: Topical Ethyl chloride.  With sterile technique and under real time ultrasound guidance:  25 mg/2.5 mL of Supartz (sodium hyaluronate) in a prefilled syringe was injected easily into the knee through a 22-gauge needle.  Anatomy appeared somewhat distorted, due to body habitus. Completed without difficulty  Pain immediately resolved suggesting accurate placement of the medication.  Advised to call if fevers/chills, erythema, induration, drainage, or persistent bleeding.  Images permanently stored and available for review in the ultrasound unit.  Impression: Technically successful ultrasound guided injection.

## 2012-12-30 ENCOUNTER — Ambulatory Visit: Payer: PRIVATE HEALTH INSURANCE | Admitting: Sports Medicine

## 2013-01-02 ENCOUNTER — Ambulatory Visit (INDEPENDENT_AMBULATORY_CARE_PROVIDER_SITE_OTHER): Payer: PRIVATE HEALTH INSURANCE | Admitting: Sports Medicine

## 2013-01-02 DIAGNOSIS — M1710 Unilateral primary osteoarthritis, unspecified knee: Secondary | ICD-10-CM

## 2013-01-02 DIAGNOSIS — M1712 Unilateral primary osteoarthritis, left knee: Secondary | ICD-10-CM

## 2013-01-02 DIAGNOSIS — IMO0002 Reserved for concepts with insufficient information to code with codable children: Secondary | ICD-10-CM

## 2013-01-02 NOTE — Progress Notes (Signed)
Michelle Rodgers has known osteoarthritis of her left knee.  Is pain free.   Procedure: Real-time Ultrasound Guided Injection of left knee Device: GE Logiq E  Ultrasound guided injection is preferred based studies that show increased duration, increased effect, greater accuracy, decreased procedural pain, increased response rate, and decreased cost with ultrasound guided versus blind injection.  Verbal informed consent obtained.  Time-out conducted.  Noted no overlying erythema, induration, or other signs of local infection.  Skin prepped in a sterile fashion.  Local anesthesia: Topical Ethyl chloride.  With sterile technique and under real time ultrasound guidance:  25 mg/2.5 mL of Supartz (sodium hyaluronate) in a prefilled syringe was injected easily into the knee through a 22-gauge needle.  Anatomy appeared somewhat distorted, due to body habitus. Completed without difficulty  Pain immediately resolved suggesting accurate placement of the medication.  Advised to call if fevers/chills, erythema, induration, drainage, or persistent bleeding.  Images permanently stored and available for review in the ultrasound unit.  Impression: Technically successful ultrasound guided injection. 

## 2013-01-02 NOTE — Assessment & Plan Note (Signed)
Currently pain-free. Intra-articular Supartz injection #4 of 5 given today. Return in one week for #5.

## 2013-01-09 ENCOUNTER — Ambulatory Visit (INDEPENDENT_AMBULATORY_CARE_PROVIDER_SITE_OTHER): Payer: PRIVATE HEALTH INSURANCE | Admitting: Sports Medicine

## 2013-01-09 DIAGNOSIS — M1710 Unilateral primary osteoarthritis, unspecified knee: Secondary | ICD-10-CM

## 2013-01-09 DIAGNOSIS — M1712 Unilateral primary osteoarthritis, left knee: Secondary | ICD-10-CM

## 2013-01-09 DIAGNOSIS — IMO0002 Reserved for concepts with insufficient information to code with codable children: Secondary | ICD-10-CM

## 2013-01-09 NOTE — Progress Notes (Signed)
Michelle Rodgers has known osteoarthritis of her left knee.  Is pain free.   Procedure: Real-time Ultrasound Guided Injection of left knee Device: GE Logiq E  Ultrasound guided injection is preferred based studies that show increased duration, increased effect, greater accuracy, decreased procedural pain, increased response rate, and decreased cost with ultrasound guided versus blind injection.  Verbal informed consent obtained.  Time-out conducted.  Noted no overlying erythema, induration, or other signs of local infection.  Skin prepped in a sterile fashion.  Local anesthesia: Topical Ethyl chloride.  With sterile technique and under real time ultrasound guidance:  25 mg/2.5 mL of Supartz (sodium hyaluronate) in a prefilled syringe was injected easily into the knee through a 22-gauge needle.  Anatomy appeared somewhat distorted, due to body habitus. Completed without difficulty  Pain immediately resolved suggesting accurate placement of the medication.  Advised to call if fevers/chills, erythema, induration, drainage, or persistent bleeding.  Images permanently stored and available for review in the ultrasound unit.  Impression: Technically successful ultrasound guided injection.

## 2013-01-09 NOTE — Assessment & Plan Note (Signed)
Currently pain-free. Intra-articular Supartz injection #5 of 5 given today. Return in 4-6 weeks to reevaluate treatment.

## 2013-01-27 ENCOUNTER — Ambulatory Visit (INDEPENDENT_AMBULATORY_CARE_PROVIDER_SITE_OTHER): Payer: PRIVATE HEALTH INSURANCE | Admitting: Family Medicine

## 2013-01-27 ENCOUNTER — Encounter: Payer: Self-pay | Admitting: Family Medicine

## 2013-01-27 ENCOUNTER — Ambulatory Visit: Payer: PRIVATE HEALTH INSURANCE | Admitting: Family Medicine

## 2013-01-27 VITALS — BP 127/77 | HR 82 | Ht 65.0 in | Wt 185.0 lb

## 2013-01-27 DIAGNOSIS — F329 Major depressive disorder, single episode, unspecified: Secondary | ICD-10-CM

## 2013-01-27 NOTE — Progress Notes (Signed)
  Subjective:    Patient ID: Michelle Rodgers, female    DOB: Sep 27, 1956, 57 y.o.   MRN: [id number]  HPI Here to followup on depression. She is currently taking Viibryd. She is feeling more in control of her emotions. Sleeping well.  Says medicaiton is working really worry.  No S.E.  Veyr happy with th drug.     Review of Systems     Objective:   Physical Exam  Constitutional: She is oriented to person, place, and time. She appears well-developed and well-nourished.  HENT:  Head: Normocephalic and atraumatic.  Cardiovascular: Normal rate, regular rhythm and normal heart sounds.   Pulmonary/Chest: Effort normal and breath sounds normal.  Neurological: She is alert and oriented to person, place, and time.  Skin: Skin is warm and dry.  Psychiatric: She has a normal mood and affect. Her behavior is normal.          Assessment & Plan:  Depression - PHQ- 9 score of 0. Very well controlled. She's doing great on her current regimen. I would like her to schedule her well woman exam sometime at the end of April. She's due for Pap smear and we can further discuss colonoscopy screening. She says she wants to get a physical and Pap first. Really happy that she is doing great. Still consider therapy in counseling if she's interested. Okay to refill. F/U in 4 months.

## 2013-02-08 ENCOUNTER — Other Ambulatory Visit: Payer: Self-pay | Admitting: Family Medicine

## 2013-02-16 ENCOUNTER — Encounter: Payer: Self-pay | Admitting: Sports Medicine

## 2013-02-16 ENCOUNTER — Ambulatory Visit (INDEPENDENT_AMBULATORY_CARE_PROVIDER_SITE_OTHER): Payer: PRIVATE HEALTH INSURANCE | Admitting: Sports Medicine

## 2013-02-16 VITALS — BP 129/88 | HR 92 | Wt 281.0 lb

## 2013-02-16 DIAGNOSIS — IMO0002 Reserved for concepts with insufficient information to code with codable children: Secondary | ICD-10-CM

## 2013-02-16 DIAGNOSIS — M1710 Unilateral primary osteoarthritis, unspecified knee: Secondary | ICD-10-CM

## 2013-02-16 DIAGNOSIS — M1712 Unilateral primary osteoarthritis, left knee: Secondary | ICD-10-CM

## 2013-02-16 NOTE — Assessment & Plan Note (Signed)
Doing very well status post Supartz series. We can certainly repeat series in about 5 months, but the series becomes ineffective, or we do not get 6 months of benefit I would send her for arthroscopy.

## 2013-02-16 NOTE — Progress Notes (Signed)
   Subjective:    CC: Followup left knee osteoarthritis  HPI:  This pleasant 57 year old female is status post an entire series of Supartz in her left knee. Her pain is significantly better than prior. She still gets some stiffness in the morning that resolves the day, but is overall very happy. Her right knee feels pretty good now but she is going to contact us should it start to hurt.  Past medical history, Surgical history, Family history not pertinant except as noted below, Social history, Allergies, and medications have been entered into the medical record, reviewed, and no changes needed.   Review of Systems: No headache, visual changes, nausea, vomiting, diarrhea, constipation, dizziness, abdominal pain, skin rash, fevers, chills, night sweats, weight loss, swollen lymph nodes, body aches, joint swelling, muscle aches, chest pain, shortness of breath, mood changes, visual or auditory hallucinations.   Objective:   General: Well Developed, well nourished, and in no acute distress.  Neuro/Psych: Alert and oriented x3, extra-ocular muscles intact, able to move all 4 extremities, sensation grossly intact. Skin: Warm and dry, no rashes noted.  Respiratory: Not using accessory muscles, speaking in full sentences, trachea midline.  Cardiovascular: Pulses palpable, no extremity edema. Abdomen: Does not appear distended. Left Knee: Normal to inspection with no erythema or effusion or obvious bony abnormalities. Palpation normal with no warmth, joint line tenderness, patellar tenderness, or condyle tenderness. ROM full in flexion and extension and lower leg rotation. Ligaments with solid consistent endpoints including ACL, PCL, LCL, MCL. Negative Mcmurray's, Apley's, and Thessalonian tests. Non painful patellar compression. Patellar glide without crepitus. Patellar and quadriceps tendons unremarkable. Hamstring and quadriceps strength is normal.  Impression and Recommendations:   This case  required medical decision making of moderate complexity.

## 2013-03-02 ENCOUNTER — Ambulatory Visit (INDEPENDENT_AMBULATORY_CARE_PROVIDER_SITE_OTHER): Payer: PRIVATE HEALTH INSURANCE | Admitting: Family Medicine

## 2013-03-02 ENCOUNTER — Encounter: Payer: Self-pay | Admitting: Family Medicine

## 2013-03-02 VITALS — BP 134/79 | HR 96 | Wt 286.0 lb

## 2013-03-02 DIAGNOSIS — R635 Abnormal weight gain: Secondary | ICD-10-CM

## 2013-03-02 DIAGNOSIS — M25569 Pain in unspecified knee: Secondary | ICD-10-CM

## 2013-03-02 MED ORDER — PHENTERMINE HCL 37.5 MG PO CAPS: 37.5000 mg | ORAL_CAPSULE | ORAL | Status: DC

## 2013-03-02 NOTE — Progress Notes (Signed)
Subjective:    Patient ID: Michelle Rodgers, female    DOB: 1956-01-15, 57 y.o.   MRN: [id number]  HPI Weight Loss - She has tried Weight Watchers in the past and lost 109 lbs.  Has done the HCG injections, worked the first couple of months but got expensive.  Has been on Adipex, fastin.  Was on med before her hysterectomy. Says can't exercise but of OA of knees. She really needs Knee replacement but needs to lose the weight first.  Highest weight was 309 lbs.  Brother died last year at 79lbs.  Son has a weight problem as well. She has never had HTN, heart problems or DM.  She denies any prior history of cardiac disease. No recent chest pain or shortness of breath. She tolerated phentermine well in the past. She is interested in a combination of phentermine and metformin.  Today have half a waffle with sugar-free syrup and then a double hamburger with unsweetened tea. She says she really struggles because she doesn't like fruit or vegetables. There are 2 vegetables that she will be his CABG and green beans. She really doesn't like water in either. She does use as little Splenda in her sweet tea but otherwise tries to avoid artificial sugars.   Review of Systems     Objective:   Physical Exam  Constitutional: She is oriented to person, place, and time. She appears well-developed and well-nourished.  HENT:  Head: Normocephalic and atraumatic.  Cardiovascular: Normal rate, regular rhythm and normal heart sounds.   Pulmonary/Chest: Effort normal and breath sounds normal.  Neurological: She is alert and oriented to person, place, and time.  Skin: Skin is warm and dry.  Psychiatric: She has a normal mood and affect. Her behavior is normal.          Assessment & Plan:  ABnormal weight gain - Discussed setting goals for weight loss.   Goals:  Exercise: Walk a mile 3 days per week.  Work on increased water intake ( says she doesn't like water) Diet Goal : says will start weight watcher (this has  been very successful for her in the past), protien drink. Not skipping meals.  Medication: will start phentermine. Warned about potential S.E I. discussed that I really want to see how well she does on one medication before we add metformin. I like to use the least a medication for the most affect. I also discussed with her side effects of metformin such as diarrhea and hypoglycemia. But certainly this is a consideration if she starts to plateau on the phentermine. Barriers:  Says craves sweets, husband is truck driver so she is by herself a lot, her knee pain.    I reviewed these goals with her and we set them together. We also reviewed her barriers to making lifestyle changes. We will continue to work on this. Followup in one month for blood pressure weight check with the nurse. Every third visit she does follow with me or if she feels like she sleeping as far as diet and exercise control I encouraged her to make her followup with me and that with a nurse so that we can reestablish and to readjust her weight loss goals. Ultimately she would like to have knee replacement surgery and I think if she were able to lose about 80 pounds I think her final results after surgery would be optimized.  Time spent 25 minutes, greater than 50% spent in counseling about goals for weight loss and increased risks  of obesity.

## 2013-05-01 ENCOUNTER — Other Ambulatory Visit: Payer: Self-pay | Admitting: Family Medicine

## 2013-05-04 ENCOUNTER — Encounter: Payer: Self-pay | Admitting: Sports Medicine

## 2013-05-04 ENCOUNTER — Ambulatory Visit (INDEPENDENT_AMBULATORY_CARE_PROVIDER_SITE_OTHER): Payer: PRIVATE HEALTH INSURANCE | Admitting: Sports Medicine

## 2013-05-04 VITALS — BP 140/82 | HR 89 | Wt 282.2 lb

## 2013-05-04 DIAGNOSIS — M1712 Unilateral primary osteoarthritis, left knee: Secondary | ICD-10-CM

## 2013-05-04 DIAGNOSIS — M1710 Unilateral primary osteoarthritis, unspecified knee: Secondary | ICD-10-CM

## 2013-05-04 DIAGNOSIS — IMO0002 Reserved for concepts with insufficient information to code with codable children: Secondary | ICD-10-CM

## 2013-05-04 NOTE — Progress Notes (Addendum)
  Subjective:    CC: Followup  HPI: This is a very pleasant 57 year old female with known DJD of the left knee, with degenerative meniscal tear as well as intra-articular loose bodies. Initially she was resistant to surgical intervention so we maximized conservative measures.  She has had several intra-articular steroid injections, she did very well initially with a series of Supartz but that only lasted approximately 2 months. She returns today with increasing pain, and some mechanical symptoms, predominately the medial joint line. Pain is localized and doesn't radiate, moderate.  Past medical history, Surgical history, Family history not pertinant except as noted below, Social history, Allergies, and medications have been entered into the medical record, reviewed, and no changes needed.   Review of Systems: No fevers, chills, night sweats, weight loss, chest pain, or shortness of breath.   Objective:    General: Well Developed, well nourished, and in no acute distress.  Neuro: Alert and oriented x3, extra-ocular muscles intact, sensation grossly intact.  HEENT: Normocephalic, atraumatic, pupils equal round reactive to light, neck supple, no masses, no lymphadenopathy, thyroid nonpalpable.  Skin: Warm and dry, no rashes. Cardiac: Regular rate and rhythm, no murmurs rubs or gallops, no lower extremity edema.  Respiratory: Clear to auscultation bilaterally. Not using accessory muscles, speaking in full sentences.  Procedure: Real-time Ultrasound Guided Injection of left knee Device: GE Logiq E  Verbal informed consent obtained.  Time-out conducted.  Noted no overlying erythema, induration, or other signs of local infection.  Skin prepped in a sterile fashion.  Local anesthesia: Topical Ethyl chloride.  With sterile technique and under real time ultrasound guidance:  2 cc Kenalog 40, 4 cc lidocaine injected easily to the suprapatellar recess. Completed without difficulty  Pain immediately  resolved suggesting accurate placement of the medication.  Advised to call if fevers/chills, erythema, induration, drainage, or persistent bleeding.  Images permanently stored and available for review in the ultrasound unit.  Impression: Technically successful ultrasound guided injection.  Procedure: Real-time Ultrasound Guided Injection of right knee Device: GE Logiq E  Verbal informed consent obtained.  Time-out conducted.  Noted no overlying erythema, induration, or other signs of local infection.  Skin prepped in a sterile fashion.  Local anesthesia: Topical Ethyl chloride.  With sterile technique and under real time ultrasound guidance:  2 cc Kenalog 40, 4 cc lidocaine injected easily to the suprapatellar recess. Completed without difficulty  Pain immediately resolved suggesting accurate placement of the medication.  Advised to call if fevers/chills, erythema, induration, drainage, or persistent bleeding.  Images permanently stored and available for review in the ultrasound unit.  Impression: Technically successful ultrasound guided injection.  Impression and Recommendations:

## 2013-05-04 NOTE — Assessment & Plan Note (Signed)
At this point, unfortunately we have failed adequate conservative measures including multiple steroid injections, and Visco supplementation which only lasted 2 months. I'm going to send her to [facility] for consideration of operative intervention, be it a scope, or total knee arthroplasty.

## 2013-05-09 ENCOUNTER — Telehealth: Payer: Self-pay | Admitting: *Deleted

## 2013-05-09 NOTE — Telephone Encounter (Signed)
Pt called and would like to get the disc from her MRI she will need this by Thursday to take with her to the orthopedic dr.Marnie Fazzino, [place]

## 2013-05-10 NOTE — Telephone Encounter (Signed)
Called and informed pt that I could not find her CD she informed me that she had just spoken to someone here and they have her disc and she is on her way to pick it up.Loralee Pacas [place]

## 2013-07-04 ENCOUNTER — Other Ambulatory Visit: Payer: Self-pay | Admitting: *Deleted

## 2013-07-04 MED ORDER — VILAZODONE HCL 40 MG PO TABS: 40.0000 mg | ORAL_TABLET | Freq: Every day | ORAL | Status: DC

## 2013-09-21 ENCOUNTER — Other Ambulatory Visit: Payer: Self-pay

## 2014-02-18 IMAGING — CR DG KNEE 1-2V*L*
2 series · 2 of 2 positions shown · non-contrast
Comparison: None.

CLINICAL DATA: Posterior lateral pain for 1 year without trauma.

LEFT KNEE - 1-2 VIEW

[view not recorded (1 of 2)]
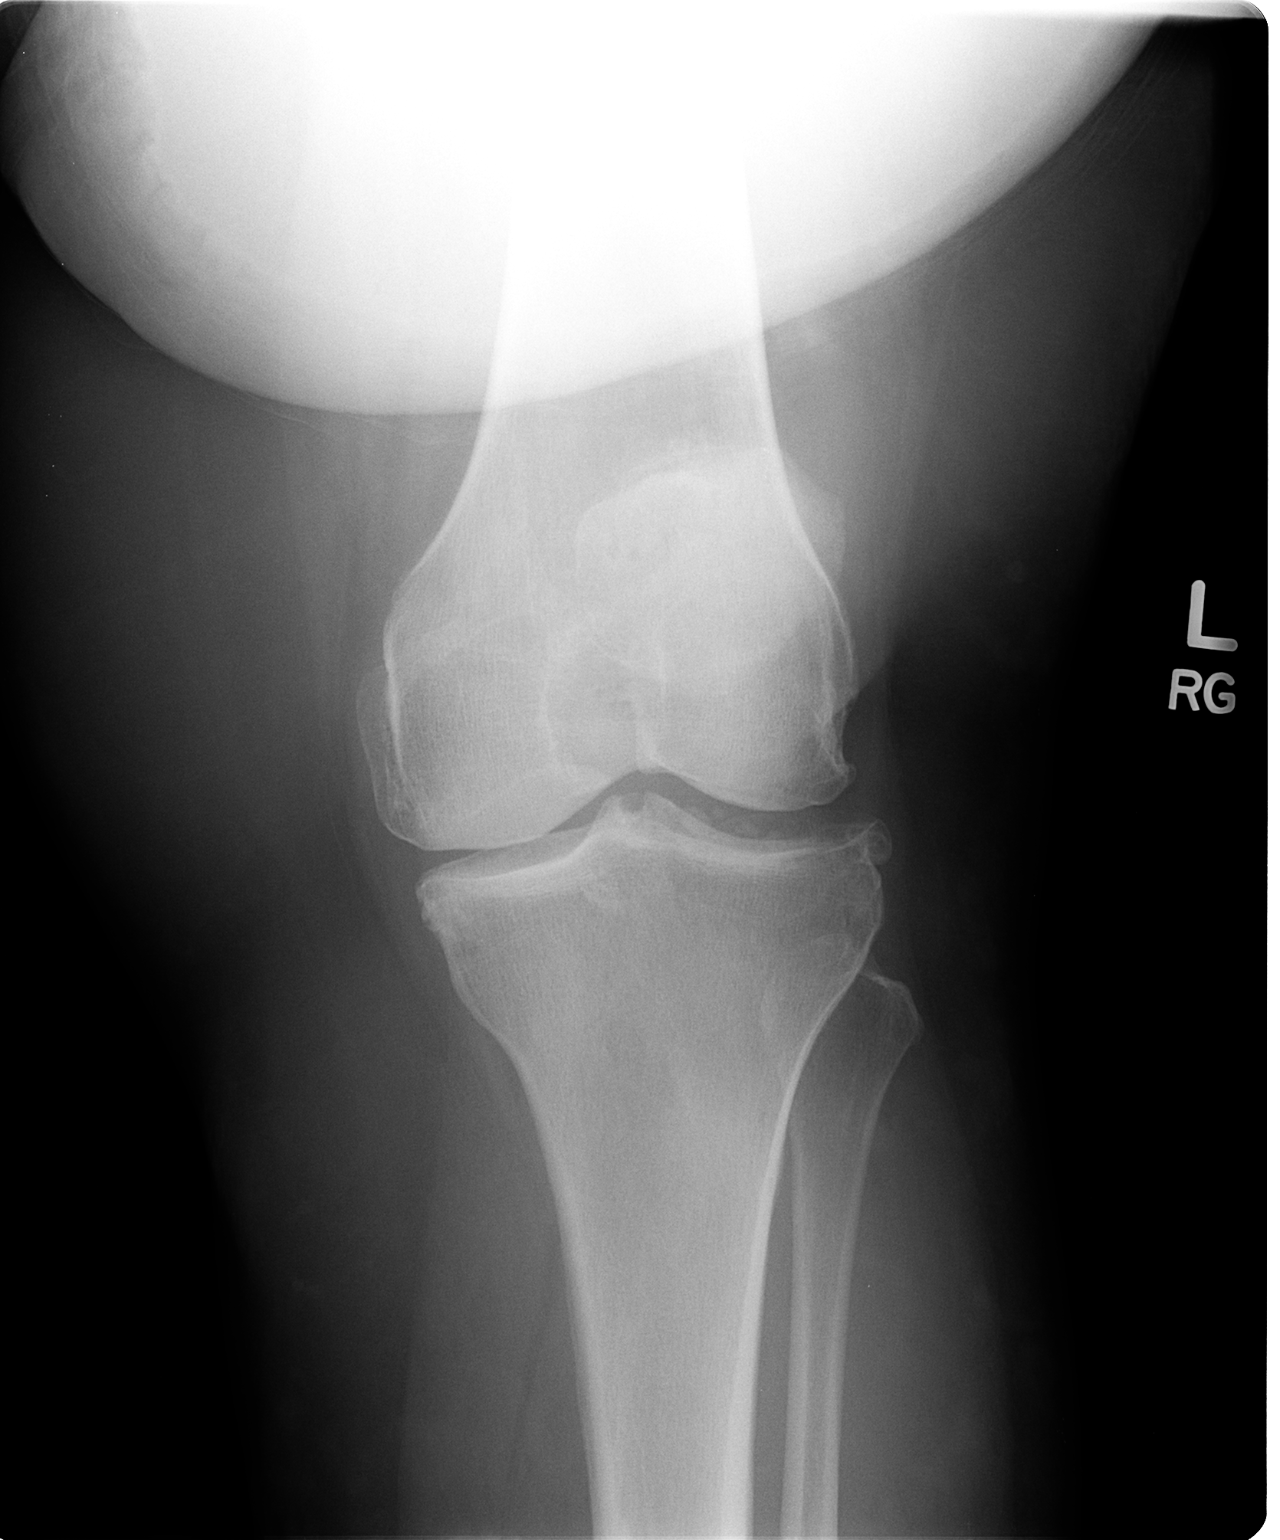

[view not recorded (2 of 2)]
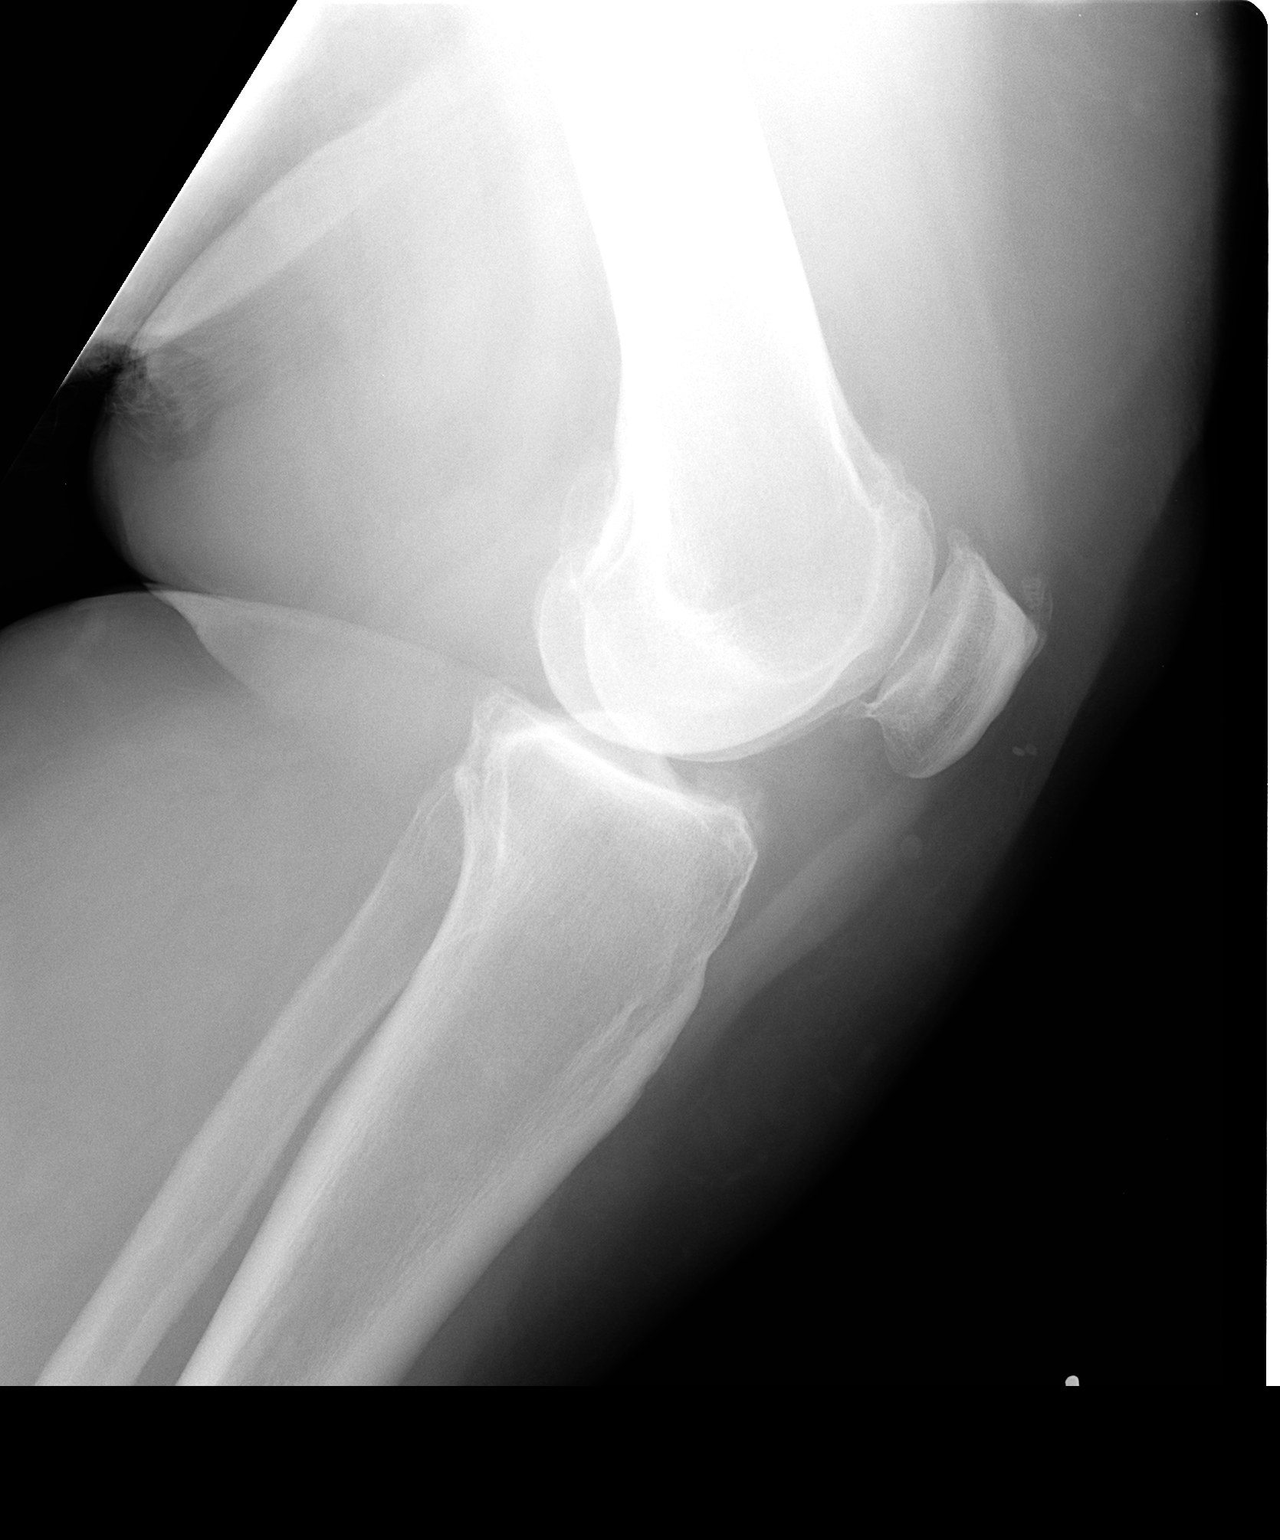

[2 of 2 positions shown; findings below may reference images not displayed]

FINDINGS: Moderate lateral and mild medial compartment joint space
narrowing and osteophyte formation.  Moderate patellofemoral
osteoarthritis.  Probable small suprapatellar joint effusion.
Suspicion of small intra-articular loose bodies, primarily
laterally and anteriorly.
IMPRESSION: Three compartment osteoarthritis with small suprapatellar joint
effusion.  Probable small intra-articular loose bodies.

## 2015-01-19 ENCOUNTER — Other Ambulatory Visit: Payer: Self-pay | Admitting: Family Medicine

## 2015-04-01 ENCOUNTER — Ambulatory Visit: Payer: Medicare Other | Admitting: Family Medicine

## 2015-04-12 ENCOUNTER — Ambulatory Visit (INDEPENDENT_AMBULATORY_CARE_PROVIDER_SITE_OTHER): Payer: PRIVATE HEALTH INSURANCE | Admitting: Sports Medicine

## 2015-04-12 ENCOUNTER — Encounter: Payer: Self-pay | Admitting: Sports Medicine

## 2015-04-12 VITALS — BP 114/75 | HR 79 | Ht 65.0 in | Wt 251.0 lb

## 2015-04-12 DIAGNOSIS — M1712 Unilateral primary osteoarthritis, left knee: Secondary | ICD-10-CM

## 2015-04-12 NOTE — Progress Notes (Signed)
  Subjective:    CC: Bilateral knee pain  HPI: Bilateral knee osteoarthritis: It's been 2 years since Izza's last injection, she returns with a recurrence of pain, moderate, persistent without radiation. Stiffness in the morning and no mechanical symptoms.  Past medical history, Surgical history, Family history not pertinant except as noted below, Social history, Allergies, and medications have been entered into the medical record, reviewed, and no changes needed.   Review of Systems: No fevers, chills, night sweats, weight loss, chest pain, or shortness of breath.   Objective:    General: Well Developed, well nourished, and in no acute distress.  Neuro: Alert and oriented x3, extra-ocular muscles intact, sensation grossly intact.  HEENT: Normocephalic, atraumatic, pupils equal round reactive to light, neck supple, no masses, no lymphadenopathy, thyroid nonpalpable.  Skin: Warm and dry, no rashes. Cardiac: Regular rate and rhythm, no murmurs rubs or gallops, no lower extremity edema.  Respiratory: Clear to auscultation bilaterally. Not using accessory muscles, speaking in full sentences.  Procedure: Real-time Ultrasound Guided Injection of right knee Device: GE Logiq E  Verbal informed consent obtained.  Time-out conducted.  Noted no overlying erythema, induration, or other signs of local infection.  Skin prepped in a sterile fashion.  Local anesthesia: Topical Ethyl chloride.  With sterile technique and under real time ultrasound guidance:  2 mL kenalog 40, 4 mL lidocaine injected easily. Completed without difficulty  Pain immediately resolved suggesting accurate placement of the medication.  Advised to call if fevers/chills, erythema, induration, drainage, or persistent bleeding.  Images permanently stored and available for review in the ultrasound unit.  Impression: Technically successful ultrasound guided injection.  Procedure: Real-time Ultrasound Guided Injection of left  knee Device: GE Logiq E  Verbal informed consent obtained.  Time-out conducted.  Noted no overlying erythema, induration, or other signs of local infection.  Skin prepped in a sterile fashion.  Local anesthesia: Topical Ethyl chloride.  With sterile technique and under real time ultrasound guidance:  2 mL kenalog 40, 4 mL lidocaine injected easily. Completed without difficulty  Pain immediately resolved suggesting accurate placement of the medication.  Advised to call if fevers/chills, erythema, induration, drainage, or persistent bleeding.  Images permanently stored and available for review in the ultrasound unit.  Impression: Technically successful ultrasound guided injection.  Impression and Recommendations:

## 2015-04-12 NOTE — Assessment & Plan Note (Signed)
Bilateral knee injection as above, previous injection was 2 years ago.  Return as needed.

## 2015-06-18 ENCOUNTER — Ambulatory Visit (INDEPENDENT_AMBULATORY_CARE_PROVIDER_SITE_OTHER): Payer: Medicare PPO | Admitting: Sports Medicine

## 2015-06-18 ENCOUNTER — Encounter: Payer: Self-pay | Admitting: Sports Medicine

## 2015-06-18 ENCOUNTER — Telehealth: Payer: Self-pay | Admitting: Sports Medicine

## 2015-06-18 VITALS — BP 123/74 | HR 79 | Ht 65.0 in | Wt 260.0 lb

## 2015-06-18 DIAGNOSIS — M1712 Unilateral primary osteoarthritis, left knee: Secondary | ICD-10-CM

## 2015-06-18 NOTE — Assessment & Plan Note (Signed)
Previous injection was 2 months ago, repeat injection but half dose. I'm also going to get her set up for Orthovisc, Supartz was only minimally effective about 3 years ago. Next step if all of this fails is total knee arthroplasty.

## 2015-06-18 NOTE — Telephone Encounter (Signed)
-----   Message from Monica Becton, MD sent at 06/18/2015  2:37 PM EDT ----- Let's go-ahead and get this patient approved for Orthovisc, bilateral primary knee arthritis, x-ray confirmed, failed steroids injections, NSAIDs, rehabilitation, please call her to schedule first injection ___________________________________________ Ihor Austin. Benjamin Stain, M.D., ABFM., CAQSM. Primary Care and Sports Medicine [facility]  Adjunct Instructor of Family Medicine  [employer]

## 2015-06-18 NOTE — Telephone Encounter (Signed)
Submitted info to Orthovisc for approval.

## 2015-06-18 NOTE — Progress Notes (Signed)
  Subjective:    CC: bilateral knee pain  HPI: This is a pleasant 59 year old female with known bilateral knee osteoarthritis, we did injections about 2 months ago, unfortunately she's having recurrence of pain, we did viscous supplementation approximately 3 years ago that had a moderate response. She is amenable to trying this again with Orthovisc. Pain is moderate, persistent, localized at the joint lines without mechanical symptoms. She does have an MRI that shows some meniscal tearing and intra-articular loose bodies but does not have any mechanical symptoms.  Past medical history, Surgical history, Family history not pertinant except as noted below, Social history, Allergies, and medications have been entered into the medical record, reviewed, and no changes needed.   Review of Systems: No fevers, chills, night sweats, weight loss, chest pain, or shortness of breath.   Objective:    General: Well Developed, well nourished, and in no acute distress.  Neuro: Alert and oriented x3, extra-ocular muscles intact, sensation grossly intact.  HEENT: Normocephalic, atraumatic, pupils equal round reactive to light, neck supple, no masses, no lymphadenopathy, thyroid nonpalpable.  Skin: Warm and dry, no rashes. Cardiac: Regular rate and rhythm, no murmurs rubs or gallops, no lower extremity edema.  Respiratory: Clear to auscultation bilaterally. Not using accessory muscles, speaking in full sentences.  Procedure: Real-time Ultrasound Guided Injection of left knee Device: GE Logiq E  Verbal informed consent obtained.  Time-out conducted.  Noted no overlying erythema, induration, or other signs of local infection.  Skin prepped in a sterile fashion.  Local anesthesia: Topical Ethyl chloride.  With sterile technique and under real time ultrasound guidance:  0.5 mL kenalog 40, 4 mL lidocaine injected easily. Completed without difficulty  Pain immediately resolved suggesting accurate placement of  the medication.  Advised to call if fevers/chills, erythema, induration, drainage, or persistent bleeding.  Images permanently stored and available for review in the ultrasound unit.  Impression: Technically successful ultrasound guided injection.  Procedure: Real-time Ultrasound Guided Injection of right knee Device: GE Logiq E  Verbal informed consent obtained.  Time-out conducted.  Noted no overlying erythema, induration, or other signs of local infection.  Skin prepped in a sterile fashion.  Local anesthesia: Topical Ethyl chloride.  With sterile technique and under real time ultrasound guidance:  0.5 mL kenalog 40, 4 mL lidocaine injected easily. Completed without difficulty  Pain immediately resolved suggesting accurate placement of the medication.  Advised to call if fevers/chills, erythema, induration, drainage, or persistent bleeding.  Images permanently stored and available for review in the ultrasound unit.  Impression: Technically successful ultrasound guided injection.  Impression and Recommendations:

## 2015-06-19 NOTE — Telephone Encounter (Signed)
Orthovisc is not the preferred injection for [facility]. To obtain approval we would need to complete pre-cert and send letter of medical necessity to [facility] (F: [phone]). If approved the injection will be covered at 80% and Rx would be covered at 80%.    Will route to Dr. Benjamin Stain to see if he would like to order a different injection or attempt to get Orthovisc approved.

## 2015-06-21 MED ORDER — SODIUM HYALURONATE (VISCOSUP) 25 MG/2.5ML IX SOSY: PREFILLED_SYRINGE | INTRA_ARTICULAR | Status: DC

## 2015-06-21 NOTE — Telephone Encounter (Signed)
Rx faxed to speciality pharmacy.

## 2015-06-21 NOTE — Telephone Encounter (Addendum)
Lets switch to Supartz, and see if this is preferred, prescription given to you.

## 2015-06-21 NOTE — Addendum Note (Signed)
Addended by: Monica Becton on: 06/21/2015 12:05 PM   Modules accepted: Orders

## 2019-07-07 DIAGNOSIS — G4733 Obstructive sleep apnea (adult) (pediatric): Secondary | ICD-10-CM | POA: Insufficient documentation

## 2019-08-08 DIAGNOSIS — G2581 Restless legs syndrome: Secondary | ICD-10-CM | POA: Insufficient documentation

## 2019-08-08 DIAGNOSIS — Z6841 Body Mass Index (BMI) 40.0 and over, adult: Secondary | ICD-10-CM | POA: Insufficient documentation

## 2020-04-01 LAB — COLOGUARD: COLOGUARD: NEGATIVE

## 2021-05-28 DIAGNOSIS — R0902 Hypoxemia: Secondary | ICD-10-CM | POA: Insufficient documentation

## 2021-05-28 DIAGNOSIS — J418 Mixed simple and mucopurulent chronic bronchitis: Secondary | ICD-10-CM | POA: Insufficient documentation

## 2022-08-05 ENCOUNTER — Encounter: Payer: Self-pay | Admitting: Family Medicine

## 2022-08-05 ENCOUNTER — Ambulatory Visit (INDEPENDENT_AMBULATORY_CARE_PROVIDER_SITE_OTHER): Payer: Medicare Other | Admitting: Family Medicine

## 2022-08-05 VITALS — BP 130/69 | HR 57 | Ht 65.0 in | Wt 268.0 lb

## 2022-08-05 DIAGNOSIS — M069 Rheumatoid arthritis, unspecified: Secondary | ICD-10-CM

## 2022-08-05 DIAGNOSIS — Z23 Encounter for immunization: Secondary | ICD-10-CM | POA: Diagnosis not present

## 2022-08-05 DIAGNOSIS — M25561 Pain in right knee: Secondary | ICD-10-CM

## 2022-08-05 DIAGNOSIS — G8929 Other chronic pain: Secondary | ICD-10-CM

## 2022-08-05 DIAGNOSIS — M545 Low back pain, unspecified: Secondary | ICD-10-CM

## 2022-08-05 DIAGNOSIS — F331 Major depressive disorder, recurrent, moderate: Secondary | ICD-10-CM

## 2022-08-05 DIAGNOSIS — M542 Cervicalgia: Secondary | ICD-10-CM | POA: Diagnosis not present

## 2022-08-05 DIAGNOSIS — E118 Type 2 diabetes mellitus with unspecified complications: Secondary | ICD-10-CM

## 2022-08-05 DIAGNOSIS — M1712 Unilateral primary osteoarthritis, left knee: Secondary | ICD-10-CM

## 2022-08-05 DIAGNOSIS — M25562 Pain in left knee: Secondary | ICD-10-CM | POA: Diagnosis not present

## 2022-08-05 LAB — POCT GLYCOSYLATED HEMOGLOBIN (HGB A1C): Hemoglobin A1C: 6.7 % — AB (ref 4.0–5.6)

## 2022-08-05 MED ORDER — CLONAZEPAM 0.5 MG PO TABS: 0.5000 mg | ORAL_TABLET | Freq: Every day | ORAL | 0 refills | Status: DC | PRN

## 2022-08-05 NOTE — Assessment & Plan Note (Addendum)
Has been on Cymbalta for quite some time for depression and mood as well as chronic low back pain.  She has been on her current dose of 60 mg daily and tolerating it well.     08/05/2022    1:25 PM  Depression screen PHQ 2/9  Decreased Interest 1  Down, Depressed, Hopeless 1  PHQ - 2 Score 2  Altered sleeping 1  Tired, decreased energy 1  Change in appetite 1  Feeling bad or failure about yourself  0  Trouble concentrating 1  Moving slowly or fidgety/restless 0  Suicidal thoughts 0  PHQ-9 Score 6  Difficult doing work/chores Somewhat difficult

## 2022-08-05 NOTE — Progress Notes (Signed)
New Patient Office Visit  Subjective    Patient ID: Michelle Rodgers, female    DOB: 02-12-1956  Age: 66 y.o. MRN: [id number]  CC:  Chief Complaint  Patient presents with   Establish Care          HPI Michelle Rodgers presents to re-establish care  She reports that she has been having bilateral upper cervical spine headaches that are radiating into the back of her head.  She says it gets worse when she tries to drive and feels like her neck just gets more tight and makes it more difficult for her to rotate her head.  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  She is also been struggling with bilateral knee pain.  She been getting an extended release steroid injection called Zilretta.  The last time she had 1 was probably 9 months ago and she is been having a lot of increased pain.  She also started using Clonazepam after her son died form COVID in 2019-03-30. She uses about 30 tabs every 3-6 months.  Usually uses about once a week lately.    She also is on medications for chronic pain management but plans on continuing with her current pain provider until she can reestablish with her former pain provider in [facility].  Outpatient Encounter Medications as of 08/05/2022  Medication Sig   albuterol (VENTOLIN HFA) 108 (90 Base) MCG/ACT inhaler Inhale 2 puffs into the lungs as needed.   atorvastatin (LIPITOR) 20 MG tablet Take 20 mg by mouth at bedtime.   diclofenac Sodium (VOLTAREN) 1 % GEL Apply 1 Application topically 4 (four) times daily.   DULoxetine (CYMBALTA) 60 MG capsule Take 60 mg by mouth daily.   fluconazole (DIFLUCAN) 150 MG tablet Take 150 mg by mouth every 3 (three) days.   Fluticasone-Umeclidin-Vilant (TRELEGY ELLIPTA) 200-62.5-25 MCG/ACT AEPB Inhale into the lungs.   folic acid (FOLVITE) 1 MG tablet Take 1 mg by mouth  daily.   furosemide (LASIX) 20 MG tablet Take by mouth.   gabapentin (NEURONTIN) 100 MG capsule Take 100 mg by mouth daily.   magnesium oxide (MAG-OX) 400 MG tablet Take by mouth.   meloxicam (MOBIC) 7.5 MG tablet SMARTSIG:1 Tablet(s) By Mouth Every 12 Hours   metFORMIN (GLUCOPHAGE) 500 MG tablet Take 500 mg by mouth daily.   methotrexate (RHEUMATREX) 2.5 MG tablet Take 15 mg by mouth once a week.   montelukast (SINGULAIR) 10 MG tablet Take 10 mg by mouth daily.   omeprazole (PRILOSEC) 40 MG capsule omeprazole 40 mg capsule,delayed release  TAKE 1 CAPSULE BY MOUTH EVERY DAY   Oxycodone HCl 20 MG TABS Take by mouth every 6 (six) hours as needed.   rOPINIRole (REQUIP) 3 MG tablet Take by mouth.   rOPINIRole (REQUIP) 4 MG tablet Take 4 mg by mouth at bedtime.   spironolactone (ALDACTONE) 100 MG tablet SMARTSIG:1 Tablet(s) By Mouth Every 12 Hours   [DISCONTINUED] Albuterol Sulfate, sensor, 108 (90 Base) MCG/ACT AEPB Inhale 2 puffs into the lungs as needed.   [DISCONTINUED] clonazePAM (KLONOPIN) 0.5 MG tablet Take 0.5 mg by mouth daily as needed.   clonazePAM (KLONOPIN) 0.5 MG tablet Take 1 tablet (0.5 mg total) by mouth daily as needed.   [DISCONTINUED] oxyCODONE-acetaminophen (PERCOCET) 10-325 MG per tablet Take 1 tablet by mouth  every 4 (four) hours as needed.   [DISCONTINUED] Sodium Hyaluronate (SUPARTZ) 25 MG/2.5ML SOSY Injected intra-articular weekly for 5 weeks, diagnosis: Primary osteoarthritis of the knee   No facility-administered encounter medications on file as of 08/05/2022.    No past medical history on file.  Past Surgical History:  Procedure Laterality Date   BACK SURGERY  12/18/2010   L4-5 post lumbar interbody fusion by Dr. 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   BREAST SURGERY     Left    CARPAL TUNNEL RELEASE     Left and right   CERVICAL DISC SURGERY  01/09/12   C4-C7   CESAREAN SECTION  1978   FOOT SURGERY     Right   GANGLION CYST EXCISION     Left    hysterec     pilonidal cyst     TOTAL ABDOMINAL HYSTERECTOMY W/ BILATERAL SALPINGOOPHORECTOMY     TUBAL LIGATION  1981    Family History  Problem Relation Age of Onset   Heart attack Mother 16       Bypass, stents   Diabetes Mother    Hypertension Mother    COPD Mother     Social History   Socioeconomic History   Marital status: Married    Spouse name: Michelle Rodgers   Number of children: 1    Years of education: Not on file   Highest education level: Not on file  Occupational History   Occupation: Disabled.   Tobacco Use   Smoking status: Every Day    Packs/day: 1.00    Years: 40.00    Total pack years: 40.00    Types: Cigarettes   Smokeless tobacco: Not on file  Substance and Sexual Activity   Alcohol use: No   Drug use: No   Sexual activity: Not Currently  Other Topics Concern   Not on file  Social History Narrative   Disabled for her back pain and her fibromyalgia.  GED. 1 caffeinated drink per day.    Social Determinants of Health   Financial Resource Strain: Not on file  Food Insecurity: Not on file  Transportation Needs: Not on file  Physical Activity: Not on file  Stress: Not on file  Social Connections: Not on file  Intimate Partner Violence: Not on file    Review of Systems  Neurological:  Positive for headaches.  All other systems reviewed and are negative.       Objective    BP 130/69   Pulse (!) 57   Ht  (1.651 m)   Wt 268 lb (121.6 kg)   SpO2 96%   BMI 44.60 kg/m   Physical Exam Vitals and nursing note reviewed.  Constitutional:      Appearance: She is well-developed.  HENT:     Head: Normocephalic and atraumatic.  Cardiovascular:     Rate and Rhythm: Normal rate and regular rhythm.     Heart sounds: Normal heart sounds.  Pulmonary:     Effort: Pulmonary effort is normal.     Breath sounds: Normal breath sounds.  Skin:    General: Skin is warm and dry.  Neurological:     Mental Status: She is alert and oriented to  person, place, and time.  Psychiatric:        Behavior: Behavior normal.         Assessment & Plan:   Problem List Items Addressed This Visit       Endocrine   Controlled diabetes mellitus type 2  with complications (HCC)    A1c definitely in the diabetic range she is on metformin.  She is at goal under 7.0 we will continue to work on some options over time.  I think she would be a fantastic candidate for a GLP-1 and we can discuss further at her follow-up next month.  We will get her caught up on her metrics if she is behind we will need to get records.  Her prior previous PCP was not on care everywhere so we were unable to get additional information and labs but she thinks her last set of labs was probably about 6 months ago.   Lab Results  Component Value Date   HGBA1C 6.7 (A) 08/05/2022         Relevant Medications   atorvastatin (LIPITOR) 20 MG tablet   metFORMIN (GLUCOPHAGE) 500 MG tablet   Other Relevant Orders   POCT glycosylated hemoglobin (Hb A1C) (Completed)   Lipid Panel w/reflex Direct LDL   COMPLETE METABOLIC PANEL WITH GFR   CBC   Urine Microalbumin w/creat. ratio     Musculoskeletal and Integument   Rheumatoid arthritis involving both hands (HCC)    We will go ahead and work on getting her referred to rheumatology it sounds like she has been on methotrexate for about 5 months but unfortunately has not had a significant response.  She is also been on a chronic low-dose of prednisone of 2.5 mg and we discussed the importance of getting her rheumatoid under better control so that she is not on a chronic steroid it is really important that we work to get her off of that some going to go ahead and again refer her to rheumatology and hopefully we can get her records from previous PCP so that we can expedite this.      Relevant Medications   meloxicam (MOBIC) 7.5 MG tablet   methotrexate (RHEUMATREX) 2.5 MG tablet   Oxycodone HCl 20 MG TABS   Other Relevant Orders    Ambulatory referral to Rheumatology   Osteoarthritis of left knee, degenerative medial meniscal tear, intra-articular loose bodies.    We will plan to get her reestablished with Dr. Benjamin Stain here in our office.      Relevant Medications   meloxicam (MOBIC) 7.5 MG tablet   methotrexate (RHEUMATREX) 2.5 MG tablet   Oxycodone HCl 20 MG TABS     Other   Depression    Has been on Cymbalta for quite some time for depression and mood as well as chronic low back pain.  She has been on her current dose of 60 mg daily and tolerating it well.     08/05/2022    1:25 PM  Depression screen PHQ 2/9  Decreased Interest 1  Down, Depressed, Hopeless 1  PHQ - 2 Score 2  Altered sleeping 1  Tired, decreased energy 1  Change in appetite 1  Feeling bad or failure about yourself  0  Trouble concentrating 1  Moving slowly or fidgety/restless 0  Suicidal thoughts 0  PHQ-9 Score 6  Difficult doing work/chores Somewhat difficult         Relevant Medications   DULoxetine (CYMBALTA) 60 MG capsule   Chronic low back pain    Cymbalta and gabapentin.      Relevant Medications   DULoxetine (CYMBALTA) 60 MG capsule   gabapentin (NEURONTIN) 100 MG capsule   meloxicam (MOBIC) 7.5 MG tablet   Oxycodone HCl 20 MG TABS   clonazePAM (KLONOPIN) 0.5 MG tablet  Bilateral knee pain    Urged her to schedule a follow-up with Dr. Dianah Field for further evaluation she has received injections with him several years ago and more recently had been getting the long-acting steroid injection called Zilretta.  We do not have that here for availability but we do have the regular steroid injections encouraged her to schedule on her way out today.      Other Visit Diagnoses     Neck pain    -  Primary   Relevant Orders   DG Cervical Spine Complete   Need for immunization against influenza       Relevant Orders   Flu Vaccine QUAD High Dose(Fluad) (Completed)       Return in about 4 weeks (around  09/02/2022) for diabetes, rheumatoid.   Beatrice Lecher, MD

## 2022-08-05 NOTE — Assessment & Plan Note (Signed)
We will go ahead and work on getting her referred to rheumatology it sounds like she has been on methotrexate for about 5 months but unfortunately has not had a significant response.  She is also been on a chronic low-dose of prednisone of 2.5 mg and we discussed the importance of getting her rheumatoid under better control so that she is not on a chronic steroid it is really important that we work to get her off of that some going to go ahead and again refer her to rheumatology and hopefully we can get her records from previous PCP so that we can expedite this.

## 2022-08-05 NOTE — Assessment & Plan Note (Signed)
A1c definitely in the diabetic range she is on metformin.  She is at goal under 7.0 we will continue to work on some options over time.  I think she would be a fantastic candidate for a GLP-1 and we can discuss further at her follow-up next month.  We will get her caught up on her metrics if she is behind we will need to get records.  Her prior previous PCP was not on care everywhere so we were unable to get additional information and labs but she thinks her last set of labs was probably about 6 months ago.   Lab Results  Component Value Date   HGBA1C 6.7 (A) 08/05/2022

## 2022-08-05 NOTE — Assessment & Plan Note (Signed)
Urged her to schedule a follow-up with Dr. Dianah Field for further evaluation she has received injections with him several years ago and more recently had been getting the long-acting steroid injection called Zilretta.  We do not have that here for availability but we do have the regular steroid injections encouraged her to schedule on her way out today.

## 2022-08-06 ENCOUNTER — Ambulatory Visit (INDEPENDENT_AMBULATORY_CARE_PROVIDER_SITE_OTHER): Payer: Medicare Other

## 2022-08-06 DIAGNOSIS — M542 Cervicalgia: Secondary | ICD-10-CM | POA: Diagnosis not present

## 2022-08-07 ENCOUNTER — Encounter: Payer: Self-pay | Admitting: Family Medicine

## 2022-08-07 ENCOUNTER — Telehealth: Payer: Self-pay | Admitting: Family Medicine

## 2022-08-07 NOTE — Assessment & Plan Note (Signed)
We will plan to get her reestablished with Dr. Dianah Field here in our office.

## 2022-08-07 NOTE — Assessment & Plan Note (Signed)
Cymbalta and gabapentin.

## 2022-08-07 NOTE — Telephone Encounter (Signed)
Call patient and let her know that I did go ahead and order some labs for her to have done in the next couple of weeks whenever she can come back to the office.  She can come up here to have them drawn after 8 AM as long as she comes before noon when that she goes on lunch break.  She will need to give a little urine sample also try to come with a full bladder as well.  She will need to fast.

## 2022-08-08 DIAGNOSIS — J449 Chronic obstructive pulmonary disease, unspecified: Secondary | ICD-10-CM | POA: Diagnosis not present

## 2022-08-10 NOTE — Telephone Encounter (Signed)
Patient called and notified of directives 

## 2022-08-10 NOTE — Progress Notes (Signed)
Hi Deepika, they could see the anterior fusion from C4 all the way down to C7 but they did not see any acute abnormalities which is good no evidence of movement or shifting of the hardware which is very reassuring.  Let me know if we need to get you back in with either sports medicine or the orthopedist.  Physical therapy could be very helpful as well.

## 2022-08-10 NOTE — Telephone Encounter (Signed)
Unable to reach patient, no voicemail.

## 2022-08-13 ENCOUNTER — Ambulatory Visit (INDEPENDENT_AMBULATORY_CARE_PROVIDER_SITE_OTHER): Payer: Medicare Other | Admitting: Sports Medicine

## 2022-08-13 ENCOUNTER — Ambulatory Visit (INDEPENDENT_AMBULATORY_CARE_PROVIDER_SITE_OTHER): Payer: Medicare Other

## 2022-08-13 DIAGNOSIS — M1712 Unilateral primary osteoarthritis, left knee: Secondary | ICD-10-CM | POA: Diagnosis not present

## 2022-08-13 NOTE — Progress Notes (Signed)
    Procedures performed today:    Procedure: Real-time Ultrasound Guided injection of the left knee Device: Samsung HS60  Verbal informed consent obtained.  Time-out conducted.  Noted no overlying erythema, induration, or other signs of local infection.  Skin prepped in a sterile fashion.  Local anesthesia: Topical Ethyl chloride.  With sterile technique and under real time ultrasound guidance: Mild effusion noted, 1 cc Kenalog 40, 2 cc lidocaine, 2 cc bupivacaine injected easily Completed without difficulty  Advised to call if fevers/chills, erythema, induration, drainage, or persistent bleeding.  Images permanently stored and available for review in PACS.  Impression: Technically successful ultrasound guided injection.  Procedure: Real-time Ultrasound Guided injection of the right knee Device: Samsung HS60  Verbal informed consent obtained.  Time-out conducted.  Noted no overlying erythema, induration, or other signs of local infection.  Skin prepped in a sterile fashion.  Local anesthesia: Topical Ethyl chloride.  With sterile technique and under real time ultrasound guidance: Mild effusion noted, 1 cc Kenalog 40, 2 cc lidocaine, 2 cc bupivacaine injected easily Completed without difficulty  Advised to call if fevers/chills, erythema, induration, drainage, or persistent bleeding.  Images permanently stored and available for review in PACS.  Impression: Technically successful ultrasound guided injection.  Independent interpretation of notes and tests performed by another provider:   None.  Brief History, Exam, Impression, and Recommendations:    Osteoarthritis of left knee, degenerative medial meniscal tear, intra-articular loose bodies. Pleasant 66 year old female, known bilateral knee osteoarthritis with intra-articular loose bodies and degenerative meniscal tearing, has not had injections in a while, repeat bilateral injections today, return as needed. Does need to lose a  lot of weight.    ____________________________________________ Gwen Her. Dianah Field, M.D., ABFM., CAQSM., AME. Primary Care and Sports Medicine [facility]  Adjunct Professor of [place] [employer]  Risk manager

## 2022-08-13 NOTE — Assessment & Plan Note (Signed)
Pleasant 66 year old female, known bilateral knee osteoarthritis with intra-articular loose bodies and degenerative meniscal tearing, has not had injections in a while, repeat bilateral injections today, return as needed. Does need to lose a lot of weight.

## 2022-09-07 DIAGNOSIS — J449 Chronic obstructive pulmonary disease, unspecified: Secondary | ICD-10-CM | POA: Diagnosis not present

## 2022-09-09 ENCOUNTER — Ambulatory Visit (INDEPENDENT_AMBULATORY_CARE_PROVIDER_SITE_OTHER): Payer: Medicare Other | Admitting: Family Medicine

## 2022-09-09 ENCOUNTER — Encounter: Payer: Self-pay | Admitting: Family Medicine

## 2022-09-09 VITALS — BP 138/63 | HR 89 | Ht 65.0 in | Wt 260.0 lb

## 2022-09-09 DIAGNOSIS — E118 Type 2 diabetes mellitus with unspecified complications: Secondary | ICD-10-CM | POA: Diagnosis not present

## 2022-09-09 DIAGNOSIS — F4321 Adjustment disorder with depressed mood: Secondary | ICD-10-CM

## 2022-09-09 DIAGNOSIS — R251 Tremor, unspecified: Secondary | ICD-10-CM

## 2022-09-09 MED ORDER — GABAPENTIN 300 MG PO CAPS: 300.0000 mg | ORAL_CAPSULE | Freq: Two times a day (BID) | ORAL | 1 refills | Status: DC

## 2022-09-09 NOTE — Progress Notes (Signed)
Established Patient Office Visit  Subjective   Patient ID: Michelle Rodgers, female    DOB: 18-Feb-1956  Age: 66 y.o. MRN: [id number]  Chief Complaint  Patient presents with   Follow-up    Pt reports that she had x rays done she has had shaking in her head and legs    HPI  She also wanted to discuss her tremor.  She is noticed that she has had a tremor in her head for months maybe even a year.  But more recently she has been noticing shaking in her legs and in her hands.  She did have a grandfather who was diagnosed with Parkinson's.  But she also reports pain and stiffness in her legs.  She says when she first gets up in the morning they just feel really tight and stiff until she gets going and they feel a little bit better.  She rarely experiences cramping.  Would also like to go up on her gabapentin.  Years ago she was on a much higher dose she is currently just taking 100 mg once a day.  Michelle Rodgers is still really grieving over the loss of her son.  His birthday is next month.  She feels like she does have some good support but its been really difficult.    ROS    Objective:     BP 138/63   Pulse 89   Ht 5\' 5"  (1.651 m)   Wt 260 lb (117.9 kg)   SpO2 96%   BMI 43.27 kg/m    Physical Exam Vitals and nursing note reviewed.  Constitutional:      Appearance: She is well-developed.  HENT:     Head: Normocephalic and atraumatic.  Cardiovascular:     Rate and Rhythm: Normal rate and regular rhythm.     Heart sounds: Normal heart sounds.  Pulmonary:     Effort: Pulmonary effort is normal.     Breath sounds: Normal breath sounds.  Skin:    General: Skin is warm and dry.  Neurological:     Mental Status: She is alert and oriented to person, place, and time.     Cranial Nerves: No cranial nerve deficit.     Motor: No weakness.     Gait: Gait abnormal.     Comments: + head tremor.  She also has an intention tremor.  It is better at rest but has a slight resting tremor in the  right hand.  When she first stood up she had some freezing similar to Parkinson's.  But then she was able to walk normally with a normal gait.  No shuffling steps.  Negative for cogwheeling.  Psychiatric:        Behavior: Behavior normal.      No results found for any visits on 09/09/22.    The ASCVD Risk score (Arnett DK, et al., 2019) failed to calculate for the following reasons:   Cannot find a previous HDL lab   Cannot find a previous total cholesterol lab    Assessment & Plan:   Problem List Items Addressed This Visit       Other   Tremor - Primary    She has some features more consistent with an essential tremor and some features that are more consistent with Parkinson's disease so its not quite clear to me.  I am get a go ahead and refer her to neurology for further work-up and evaluation.  She is also been having a lot of bilateral leg  pain but I think it is probably more related to her arthritis.      Relevant Orders   Ambulatory referral to Neurology   Other Visit Diagnoses     Grief          Grief-just encouraged her to reach out if at any point she feels like doing any grief therapy would be helpful she did do some brief therapy with hospice right after he passed away.  She will get her labs drawn today.   No follow-ups on file.    Beatrice Lecher, MD

## 2022-09-09 NOTE — Assessment & Plan Note (Signed)
She has some features more consistent with an essential tremor and some features that are more consistent with Parkinson's disease so its not quite clear to me.  I am get a go ahead and refer her to neurology for further work-up and evaluation.  She is also been having a lot of bilateral leg pain but I think it is probably more related to her arthritis.

## 2022-09-10 LAB — COMPLETE METABOLIC PANEL WITH GFR
AG Ratio: 1.4 (calc) (ref 1.0–2.5)
ALT: 10 U/L (ref 6–29)
AST: 10 U/L (ref 10–35)
Albumin: 4.1 g/dL (ref 3.6–5.1)
Alkaline phosphatase (APISO): 89 U/L (ref 37–153)
BUN: 18 mg/dL (ref 7–25)
CO2: 26 mmol/L (ref 20–32)
Calcium: 9.4 mg/dL (ref 8.6–10.4)
Chloride: 100 mmol/L (ref 98–110)
Creat: 0.78 mg/dL (ref 0.50–1.05)
Globulin: 2.9 g/dL (calc) (ref 1.9–3.7)
Glucose, Bld: 175 mg/dL — ABNORMAL HIGH (ref 65–139)
Potassium: 4.5 mmol/L (ref 3.5–5.3)
Sodium: 135 mmol/L (ref 135–146)
Total Bilirubin: 0.3 mg/dL (ref 0.2–1.2)
Total Protein: 7 g/dL (ref 6.1–8.1)
eGFR: 84 mL/min/{1.73_m2} (ref >=60)

## 2022-09-10 LAB — CBC
HCT: 50.7 % — ABNORMAL HIGH (ref 35.0–45.0)
Hemoglobin: 17.5 g/dL — ABNORMAL HIGH (ref 11.7–15.5)
MCH: 32.5 pg (ref 27.0–33.0)
MCHC: 34.5 g/dL (ref 32.0–36.0)
MCV: 94.2 fL (ref 80.0–100.0)
MPV: 10.1 fL (ref 7.5–12.5)
Platelets: 317 10*3/uL (ref 140–400)
RBC: 5.38 10*6/uL — ABNORMAL HIGH (ref 3.80–5.10)
RDW: 13 % (ref 11.0–15.0)
WBC: 8.7 10*3/uL (ref 3.8–10.8)

## 2022-09-10 LAB — LIPID PANEL W/REFLEX DIRECT LDL
Cholesterol: 142 mg/dL (ref <200)
HDL: 45 mg/dL — ABNORMAL LOW (ref >=50)
LDL Cholesterol (Calc): 71 mg/dL (calc)
Non-HDL Cholesterol (Calc): 97 mg/dL (calc) (ref <130)
Total CHOL/HDL Ratio: 3.2 (calc) (ref <5.0)
Triglycerides: 180 mg/dL — ABNORMAL HIGH (ref <150)

## 2022-09-10 LAB — MICROALBUMIN / CREATININE URINE RATIO
Creatinine, Urine: 83 mg/dL (ref 20–275)
Microalb Creat Ratio: 11 mcg/mg creat (ref <30)
Microalb, Ur: 0.9 mg/dL

## 2022-09-11 ENCOUNTER — Other Ambulatory Visit: Payer: Self-pay | Admitting: Family Medicine

## 2022-09-11 DIAGNOSIS — Z720 Tobacco use: Secondary | ICD-10-CM

## 2022-09-11 NOTE — Progress Notes (Signed)
3, LDL looks good.  Triglycerides are up a little bit just encouraged her to continue to work on healthy food choices.  Your metabolic panel overall looks good.  Hemoglobin is up a little bit that is usually secondary to smoking.  Also just need to make sure that you are hydrating well.  No excess protein in the urine which is reassuring.  Would like to refer you for a lung scan for lung cancer screening not because I think that you have it but you do have enough risk factors that I think we should evaluate that further.  If you are okay with this then please let me know.  We usually do it through our pulmonary department.

## 2022-09-11 NOTE — Progress Notes (Signed)
Referral placed for lung cancer screening. 

## 2022-09-18 ENCOUNTER — Other Ambulatory Visit: Payer: Self-pay | Admitting: Family Medicine

## 2022-09-18 DIAGNOSIS — Z1231 Encounter for screening mammogram for malignant neoplasm of breast: Secondary | ICD-10-CM

## 2022-09-24 ENCOUNTER — Ambulatory Visit: Payer: Medicare Other

## 2022-09-28 ENCOUNTER — Telehealth: Payer: Self-pay | Admitting: Family Medicine

## 2022-09-28 DIAGNOSIS — M1712 Unilateral primary osteoarthritis, left knee: Secondary | ICD-10-CM

## 2022-09-28 DIAGNOSIS — M069 Rheumatoid arthritis, unspecified: Secondary | ICD-10-CM

## 2022-09-28 DIAGNOSIS — G8929 Other chronic pain: Secondary | ICD-10-CM

## 2022-09-28 DIAGNOSIS — G894 Chronic pain syndrome: Secondary | ICD-10-CM

## 2022-09-28 NOTE — Telephone Encounter (Signed)
Ok to place referral.

## 2022-09-28 NOTE — Telephone Encounter (Signed)
Patient called and requested a referral to Dr. Roderic Ovens at [facility] for pain management. Patient requested to be notified by phone on file if referral is sent in. Please advise. Katha Hamming

## 2022-09-30 NOTE — Telephone Encounter (Signed)
Called and advised pt of referral she stated that she had already been contacted

## 2022-10-08 DIAGNOSIS — J449 Chronic obstructive pulmonary disease, unspecified: Secondary | ICD-10-CM | POA: Diagnosis not present

## 2022-10-16 DIAGNOSIS — M961 Postlaminectomy syndrome, not elsewhere classified: Secondary | ICD-10-CM | POA: Diagnosis not present

## 2022-10-16 DIAGNOSIS — Z5181 Encounter for therapeutic drug level monitoring: Secondary | ICD-10-CM | POA: Diagnosis not present

## 2022-10-16 DIAGNOSIS — Z79899 Other long term (current) drug therapy: Secondary | ICD-10-CM | POA: Diagnosis not present

## 2022-10-23 ENCOUNTER — Ambulatory Visit: Payer: Medicare Other | Admitting: Sports Medicine

## 2022-10-28 DIAGNOSIS — M47816 Spondylosis without myelopathy or radiculopathy, lumbar region: Secondary | ICD-10-CM | POA: Diagnosis not present

## 2022-10-28 DIAGNOSIS — Z981 Arthrodesis status: Secondary | ICD-10-CM | POA: Diagnosis not present

## 2022-10-28 DIAGNOSIS — M5136 Other intervertebral disc degeneration, lumbar region: Secondary | ICD-10-CM | POA: Diagnosis not present

## 2022-10-28 DIAGNOSIS — M47814 Spondylosis without myelopathy or radiculopathy, thoracic region: Secondary | ICD-10-CM | POA: Diagnosis not present

## 2022-10-30 DIAGNOSIS — M961 Postlaminectomy syndrome, not elsewhere classified: Secondary | ICD-10-CM | POA: Diagnosis not present

## 2022-10-30 DIAGNOSIS — G90529 Complex regional pain syndrome I of unspecified lower limb: Secondary | ICD-10-CM | POA: Diagnosis not present

## 2022-11-03 ENCOUNTER — Ambulatory Visit (INDEPENDENT_AMBULATORY_CARE_PROVIDER_SITE_OTHER): Payer: Medicare Other | Admitting: Sports Medicine

## 2022-11-03 ENCOUNTER — Telehealth: Payer: Self-pay | Admitting: Sports Medicine

## 2022-11-03 ENCOUNTER — Ambulatory Visit (INDEPENDENT_AMBULATORY_CARE_PROVIDER_SITE_OTHER): Payer: Medicare Other

## 2022-11-03 DIAGNOSIS — M1712 Unilateral primary osteoarthritis, left knee: Secondary | ICD-10-CM | POA: Diagnosis not present

## 2022-11-03 MED ORDER — TRIAMCINOLONE ACETONIDE 40 MG/ML IJ SUSP
80.0000 mg | Freq: Once | INTRAMUSCULAR | Status: AC
  Administered 2022-11-03: 80 mg via INTRAMUSCULAR

## 2022-11-03 NOTE — Progress Notes (Signed)
    Procedures performed today:    Procedure: Real-time Ultrasound Guided injection of the left knee Device: Samsung HS60  Verbal informed consent obtained.  Time-out conducted.  Noted no overlying erythema, induration, or other signs of local infection.  Skin prepped in a sterile fashion.  Local anesthesia: Topical Ethyl chloride.  With sterile technique and under real time ultrasound guidance: Mild effusion noted, 1 cc Kenalog 40, 2 cc lidocaine, 2 cc bupivacaine injected easily Completed without difficulty  Advised to call if fevers/chills, erythema, induration, drainage, or persistent bleeding.  Images permanently stored and available for review in PACS.  Impression: Technically successful ultrasound guided injection.   Procedure: Real-time Ultrasound Guided injection of the right knee Device: Samsung HS60  Verbal informed consent obtained.  Time-out conducted.  Noted no overlying erythema, induration, or other signs of local infection.  Skin prepped in a sterile fashion.  Local anesthesia: Topical Ethyl chloride.  With sterile technique and under real time ultrasound guidance: Mild effusion noted, 1 cc Kenalog 40, 2 cc lidocaine, 2 cc bupivacaine injected easily Completed without difficulty  Advised to call if fevers/chills, erythema, induration, drainage, or persistent bleeding.  Images permanently stored and available for review in PACS.  Impression: Technically successful ultrasound guided injection.  Independent interpretation of notes and tests performed by another provider:   None.  Brief History, Exam, Impression, and Recommendations:    Osteoarthritis of left knee, degenerative medial meniscal tear, intra-articular loose bodies. Very pleasant 66 year old female, chronic bilateral knee pain with known osteoarthritis, intra-articular loose bodies and degenerative meniscal tearing, last injections were 3 months ago, repeated today. She does note that she had some  waning efficacy so we will proceed with approval for viscosupplementation.    ____________________________________________ Ihor Austin. Benjamin Stain, M.D., ABFM., CAQSM., AME. Primary Care and Sports Medicine [facility]  Adjunct Professor of Family Medicine  [employer]  Restaurant manager, fast food

## 2022-11-03 NOTE — Telephone Encounter (Signed)
Bilateral x-ray confirmed osteoarthritis, has failed analgesics, steroid injections, greater than 6 weeks of home therapy, need approval for bilateral viscosupplementation

## 2022-11-03 NOTE — Assessment & Plan Note (Addendum)
Very pleasant 66 year old female, chronic bilateral knee pain with known osteoarthritis, intra-articular loose bodies and degenerative meniscal tearing, last injections were 3 months ago, repeated today. She does note that she had some waning efficacy so we will proceed with approval for viscosupplementation.

## 2022-11-04 NOTE — Telephone Encounter (Signed)
PA information submitted via [email or web address] for Orthovisc Paperwork has been printed and given to Dr. T for signatures. Once obtained, information will be faxed to [employer] at [phone]  

## 2022-11-07 DIAGNOSIS — J449 Chronic obstructive pulmonary disease, unspecified: Secondary | ICD-10-CM | POA: Diagnosis not present

## 2022-11-11 NOTE — Telephone Encounter (Signed)
Benefits Investigation Details received from [employer] Injection: Orthovisc  Medical: deductible does apply patient has met so patient is responsible for coinsurance  PA required: No Pharmacy: the product is not covered under the pharmacy plan May fill through: Buy and Bill OV Copay/Coinsurance: 20% Product Copay: 20% Administration Coinsurance: 20% Administration Copay: 0 Deductible: $226 (met: $226)

## 2022-11-23 ENCOUNTER — Ambulatory Visit: Payer: Medicare Other | Admitting: Family Medicine

## 2022-11-25 NOTE — Telephone Encounter (Signed)
Patient notified of her cost and she states she will pass for now cause couldn't afford it at this time and she states may use in future.

## 2022-12-03 DIAGNOSIS — R251 Tremor, unspecified: Secondary | ICD-10-CM | POA: Diagnosis not present

## 2022-12-03 DIAGNOSIS — M961 Postlaminectomy syndrome, not elsewhere classified: Secondary | ICD-10-CM | POA: Diagnosis not present

## 2022-12-03 DIAGNOSIS — G90529 Complex regional pain syndrome I of unspecified lower limb: Secondary | ICD-10-CM | POA: Diagnosis not present

## 2022-12-08 DIAGNOSIS — J449 Chronic obstructive pulmonary disease, unspecified: Secondary | ICD-10-CM | POA: Diagnosis not present

## 2022-12-14 ENCOUNTER — Ambulatory Visit (INDEPENDENT_AMBULATORY_CARE_PROVIDER_SITE_OTHER): Payer: Medicare Other | Admitting: Family Medicine

## 2022-12-14 ENCOUNTER — Encounter: Payer: Self-pay | Admitting: Family Medicine

## 2022-12-14 VITALS — BP 120/84 | HR 85 | Ht 65.0 in | Wt 244.0 lb

## 2022-12-14 DIAGNOSIS — E1165 Type 2 diabetes mellitus with hyperglycemia: Secondary | ICD-10-CM | POA: Diagnosis not present

## 2022-12-14 DIAGNOSIS — M1712 Unilateral primary osteoarthritis, left knee: Secondary | ICD-10-CM | POA: Diagnosis not present

## 2022-12-14 DIAGNOSIS — E118 Type 2 diabetes mellitus with unspecified complications: Secondary | ICD-10-CM

## 2022-12-14 LAB — POCT GLYCOSYLATED HEMOGLOBIN (HGB A1C): HbA1c POC (<> result, manual entry): 12.3 % (ref 4.0–5.6)

## 2022-12-14 MED ORDER — SPIRONOLACTONE 100 MG PO TABS: ORAL_TABLET | ORAL | 3 refills | Status: DC

## 2022-12-14 MED ORDER — SYNJARDY 12.5-1000 MG PO TABS: 1.0000 | ORAL_TABLET | Freq: Two times a day (BID) | ORAL | 2 refills | Status: DC

## 2022-12-14 MED ORDER — LANCETS MISC. MISC: 1.0000 | Freq: Three times a day (TID) | 0 refills | Status: AC

## 2022-12-14 MED ORDER — MELOXICAM 7.5 MG PO TABS: ORAL_TABLET | ORAL | 1 refills | Status: DC

## 2022-12-14 MED ORDER — BLOOD GLUCOSE TEST VI STRP: 1.0000 | ORAL_STRIP | Freq: Three times a day (TID) | 0 refills | Status: DC

## 2022-12-14 MED ORDER — LANCET DEVICE MISC: 1.0000 | Freq: Three times a day (TID) | 0 refills | Status: DC

## 2022-12-14 MED ORDER — BLOOD GLUCOSE MONITORING SUPPL DEVI: 1.0000 | Freq: Three times a day (TID) | 0 refills | Status: DC

## 2022-12-14 NOTE — Patient Instructions (Signed)
Stop metformin and start new pill called Iva Boop

## 2022-12-14 NOTE — Progress Notes (Signed)
Established Patient Office Visit  Subjective   Patient ID: Michelle Rodgers, female    DOB: 1956-11-06  Age: 67 y.o. MRN: [id number]  Chief Complaint  Patient presents with   Follow-up    HPI   Diabetes - glucose has been running in the 500s lately.  She was told to go to the ED. that she noticed an odor to her urine a couple of weeks ago and then decided to test her blood sugar she was not having any dysuria or hematuria.  Blood sugar was greater than 500 she was using her husband's meter.  She would like to have her own she says she is still taking her metformin she denies any recent changes in her diet she says will try to be really careful with her diet since noticing the high blood sugars she has been able to get it down to 200s.  No recent illnesses or fevers or chills.  She has also noticed blurry vision.  Since trying to adjust her diet the odor in the urine has resolved.  She is also been losing weight without trying.  She has not received any steroids or steroid injections.    ROS    Objective:     BP 120/84   Pulse 85   Ht 5\' 5"  (1.651 m)   Wt 244 lb (110.7 kg)   SpO2 99%   BMI 40.60 kg/m    Physical Exam Vitals and nursing note reviewed.  Constitutional:      Appearance: She is well-developed.  HENT:     Head: Normocephalic and atraumatic.  Cardiovascular:     Rate and Rhythm: Normal rate and regular rhythm.     Heart sounds: Normal heart sounds.  Pulmonary:     Effort: Pulmonary effort is normal.     Breath sounds: Normal breath sounds.  Skin:    General: Skin is warm and dry.  Neurological:     Mental Status: She is alert and oriented to person, place, and time.  Psychiatric:        Behavior: Behavior normal.      Results for orders placed or performed in visit on 12/14/22  POCT HgB A1C  Result Value Ref Range   Hemoglobin A1C     HbA1c POC (<> result, manual entry) 12.3 4.0 - 5.6 %   HbA1c, POC (prediabetic range)     HbA1c, POC (controlled  diabetic range)        The 10-year ASCVD risk score (Arnett DK, et al., 2019) is: 15.3%    Assessment & Plan:   Problem List Items Addressed This Visit       Endocrine   Controlled diabetes mellitus type 2 with complications ([place]) - Primary    A1c jumped up significantly from 6.9-12.3 today.  This is a pretty tremendous jump in blood sugars.  She denies any recent medication changes she says she still taking her metformin and has not been eating any differently she has not received any prednisone or steroid injections.  She has not changed what she is drinking she says she mostly drinks water.  We did discontinue her metformin and switching to [place].  Continue to work on diet.  I like to get some labs today just to rule out any major changes that could be contributing such as some mild pancreatitis etc.  Will call with results once available.  Otherwise follow-up in 1 month.  Will send over new prescription for glucometer lancets and strips.  Lab  Results  Component Value Date   HGBA1C 12.3 12/14/2022        Relevant Medications   Empagliflozin-metFORMIN HCl (SYNJARDY) 12.03-999 MG TABS   Other Relevant Orders   POCT HgB A1C (Completed)     Musculoskeletal and Integument   Osteoarthritis of left knee, degenerative medial meniscal tear, intra-articular loose bodies.   Relevant Medications   meloxicam (MOBIC) 7.5 MG tablet   Other Visit Diagnoses     Uncontrolled type 2 diabetes mellitus with hyperglycemia (HCC)       Relevant Medications   Empagliflozin-metFORMIN HCl (SYNJARDY) 12.03-999 MG TABS   Other Relevant Orders   COMPLETE METABOLIC PANEL WITH GFR   Lipase   TSH   Insulin, Free (Bioactive)   C-peptide       Return in about 1 month (around 01/14/2023).    Beatrice Lecher, MD

## 2022-12-14 NOTE — Assessment & Plan Note (Signed)
A1c jumped up significantly from 6.9-12.3 today.  This is a pretty tremendous jump in blood sugars.  She denies any recent medication changes she says she still taking her metformin and has not been eating any differently she has not received any prednisone or steroid injections.  She has not changed what she is drinking she says she mostly drinks water.  We did discontinue her metformin and switching to [place].  Continue to work on diet.  I like to get some labs today just to rule out any major changes that could be contributing such as some mild pancreatitis etc.  Will call with results once available.  Otherwise follow-up in 1 month.  Will send over new prescription for glucometer lancets and strips.  Lab Results  Component Value Date   HGBA1C 12.3 12/14/2022

## 2022-12-15 NOTE — Progress Notes (Signed)
No sign of pancreatitis.  2 labs still pending.

## 2022-12-15 NOTE — Progress Notes (Signed)
Hi Michelle Rodgers, general labs look okay.  Hopefully you are starting to notice some improvement in your blood sugars.  I also encourage you to consider getting a tetanus vaccine updated at the pharmacy.  It is free with Medicare this year.  Shingles vaccines also free with Medicare this year if you get it done at the pharmacy as well.  Also let us know if you have had colon cancer screening I am not sure if you had this done in the last several years but would like to get an up-to-date report.

## 2022-12-22 ENCOUNTER — Telehealth: Payer: Self-pay | Admitting: Family Medicine

## 2022-12-22 DIAGNOSIS — E118 Type 2 diabetes mellitus with unspecified complications: Secondary | ICD-10-CM

## 2022-12-22 NOTE — Telephone Encounter (Signed)
Pt called asking to speak to Audelia Hives about a glucose monitor. Pt also asked for a refill of spironolactone (ALDACTONE) 100 MG tablet [[id number]] . Please call pt back at [phone].

## 2022-12-23 MED ORDER — BLOOD GLUCOSE MONITORING SUPPL DEVI: 1.0000 | Freq: Three times a day (TID) | 0 refills | Status: AC

## 2022-12-23 MED ORDER — BLOOD GLUCOSE TEST VI STRP: 1.0000 | ORAL_STRIP | Freq: Three times a day (TID) | 0 refills | Status: DC

## 2022-12-23 MED ORDER — LANCET DEVICE MISC: 1.0000 | Freq: Three times a day (TID) | 0 refills | Status: AC

## 2022-12-23 MED ORDER — SPIRONOLACTONE 100 MG PO TABS: ORAL_TABLET | ORAL | 3 refills | Status: DC

## 2022-12-23 NOTE — Telephone Encounter (Signed)
Spoke w/pt she doesn't use [facility] will send to her local pharmacy.

## 2022-12-25 LAB — COMPLETE METABOLIC PANEL WITH GFR
AG Ratio: 1.4 (calc) (ref 1.0–2.5)
ALT: 13 U/L (ref 6–29)
AST: 13 U/L (ref 10–35)
Albumin: 4 g/dL (ref 3.6–5.1)
Alkaline phosphatase (APISO): 83 U/L (ref 37–153)
BUN: 15 mg/dL (ref 7–25)
CO2: 29 mmol/L (ref 20–32)
Calcium: 9.1 mg/dL (ref 8.6–10.4)
Chloride: 97 mmol/L — ABNORMAL LOW (ref 98–110)
Creat: 1 mg/dL (ref 0.50–1.05)
Globulin: 2.9 g/dL (calc) (ref 1.9–3.7)
Glucose, Bld: 294 mg/dL — ABNORMAL HIGH (ref 65–99)
Potassium: 4.3 mmol/L (ref 3.5–5.3)
Sodium: 134 mmol/L — ABNORMAL LOW (ref 135–146)
Total Bilirubin: 0.4 mg/dL (ref 0.2–1.2)
Total Protein: 6.9 g/dL (ref 6.1–8.1)
eGFR: 63 mL/min/{1.73_m2} (ref >=60)

## 2022-12-25 LAB — C-PEPTIDE: C-Peptide: 4.42 ng/mL — ABNORMAL HIGH (ref 0.80–3.85)

## 2022-12-25 LAB — INSULIN, FREE (BIOACTIVE): Insulin, Free: 8 u[IU]/mL (ref 1.5–14.9)

## 2022-12-25 LAB — TSH: TSH: 3.33 mIU/L (ref 0.40–4.50)

## 2022-12-25 LAB — LIPASE: Lipase: 9 U/L (ref 7–60)

## 2022-12-28 DIAGNOSIS — G894 Chronic pain syndrome: Secondary | ICD-10-CM | POA: Diagnosis not present

## 2022-12-28 DIAGNOSIS — Z4542 Encounter for adjustment and management of neuropacemaker (brain) (peripheral nerve) (spinal cord): Secondary | ICD-10-CM | POA: Diagnosis not present

## 2022-12-28 DIAGNOSIS — M961 Postlaminectomy syndrome, not elsewhere classified: Secondary | ICD-10-CM | POA: Diagnosis not present

## 2022-12-28 DIAGNOSIS — G90529 Complex regional pain syndrome I of unspecified lower limb: Secondary | ICD-10-CM | POA: Diagnosis not present

## 2023-01-08 DIAGNOSIS — J449 Chronic obstructive pulmonary disease, unspecified: Secondary | ICD-10-CM | POA: Diagnosis not present

## 2023-01-15 ENCOUNTER — Ambulatory Visit (INDEPENDENT_AMBULATORY_CARE_PROVIDER_SITE_OTHER): Payer: Medicare Other | Admitting: Family Medicine

## 2023-01-15 ENCOUNTER — Encounter: Payer: Self-pay | Admitting: Family Medicine

## 2023-01-15 VITALS — BP 126/63 | HR 86 | Ht 65.0 in | Wt 238.0 lb

## 2023-01-15 DIAGNOSIS — R0982 Postnasal drip: Secondary | ICD-10-CM

## 2023-01-15 DIAGNOSIS — G2581 Restless legs syndrome: Secondary | ICD-10-CM | POA: Diagnosis not present

## 2023-01-15 DIAGNOSIS — E118 Type 2 diabetes mellitus with unspecified complications: Secondary | ICD-10-CM

## 2023-01-15 MED ORDER — ROPINIROLE HCL 3 MG PO TABS: 3.0000 mg | ORAL_TABLET | Freq: Every morning | ORAL | 1 refills | Status: DC

## 2023-01-15 MED ORDER — IPRATROPIUM BROMIDE 0.03 % NA SOLN: 2.0000 | Freq: Two times a day (BID) | NASAL | 1 refills | Status: DC

## 2023-01-15 MED ORDER — ROPINIROLE HCL 4 MG PO TABS: 4.0000 mg | ORAL_TABLET | Freq: Every day | ORAL | 1 refills | Status: DC

## 2023-01-15 NOTE — Assessment & Plan Note (Signed)
Sugars are improving.  Continue work on [employer] and exercise I will see her back in 7 weeks.  Her A1c should hopefully be back under 8 at that point and we can make some adjustments as needed.  Continue to work on increasing vegetable intake.

## 2023-01-15 NOTE — Addendum Note (Signed)
Addended by: Beatrice Lecher D on: 01/15/2023 05:02 PM   Modules accepted: Orders

## 2023-01-15 NOTE — Assessment & Plan Note (Signed)
Will send over new rx for her medication.  Were previously prescribed by another provider.

## 2023-01-15 NOTE — Progress Notes (Signed)
Established Patient Office Visit  Subjective   Patient ID: Michelle Rodgers, female    DOB: 04-13-1956  Age: 67 y.o. MRN: [id number]  Chief Complaint  Patient presents with   Follow-up    Synjardy  A1c today 9.9%    HPI  F/U uncontrolled DM -tolerating new Synjardy well. Costing about 50 dollars.  Says she has had sugars between 108-149.     Also likely take over the prescription for her ropinirole that she uses for her restless leg.  She takes 3 mg in the morning and 4 mg in the evenings.  Also been waking up in the morning occasionally feeling very nauseated she feels like a lot of it to do with drainage from her sinuses and gets into her stomach and then makes her feel very nauseated and then she actually vomits.  It does not happen daily.  She does not have any abdominal pain with it.  She denies any allergy symptoms.  In regards to her chronic pain management.  She is on Xtampza.  She has been on it for almost 2 months now she follows with Dr. Mechele Dawley.  She feels like she is doing well on the medication and has not had any concerns.  It is definitely more expensive than her prior medication.     ROS    Objective:     BP 126/63 (BP Location: Left Arm, Patient Position: Sitting, Cuff Size: Large)   Pulse 86   Ht '5\' 5"'$  (1.651 m)   Wt 238 lb (108 kg)   SpO2 98%   BMI 39.61 kg/m    Physical Exam Vitals reviewed.  Constitutional:      Appearance: She is well-developed.  HENT:     Head: Normocephalic and atraumatic.  Eyes:     Conjunctiva/sclera: Conjunctivae normal.  Cardiovascular:     Rate and Rhythm: Normal rate.  Pulmonary:     Effort: Pulmonary effort is normal.  Skin:    General: Skin is dry.     Coloration: Skin is not pale.  Neurological:     Mental Status: She is alert and oriented to person, place, and time.  Psychiatric:        Behavior: Behavior normal.      No results found for any visits on 01/15/23.    The 10-year ASCVD risk score (Arnett  DK, et al., 2019) is: 16.6%    Assessment & Plan:   Problem List Items Addressed This Visit       Endocrine   Controlled diabetes mellitus type 2 with complications ([place])    Sugars are improving.  Continue work on Mirant and exercise I will see her back in 7 weeks.  Her A1c should hopefully be back under 8 at that point and we can make some adjustments as needed.  Continue to work on increasing vegetable intake.        Other   Restless legs - Primary    Will send over new rx for her medication.  Were previously prescribed by another provider.      Relevant Medications   rOPINIRole (REQUIP) 3 MG tablet   rOPINIRole (REQUIP) 4 MG tablet   Other Visit Diagnoses     Post-nasal drip       Relevant Medications   ipratropium (ATROVENT) 0.03 % nasal spray      Postnasal drip that is triggering nausea and occasional vomiting in the mornings-we discussed using a nasal brace such as Atrovent to help decrease secretions.  She said she did try Flonase for short period of time and did not find it helpful she thought nausea medicine would help but typically when she feels nauseated she actually vomits pretty quickly so I do not think it would really be in her system long enough to help I think just trying to decrease the secretions and see if that helps avoid triggering the nausea and the vomiting would be more helpful if it continues or worsens then she may need to see GI for further workup.  Return in about 7 weeks (around 03/05/2023) for Diabetes follow-up.    Beatrice Lecher, MD

## 2023-01-20 ENCOUNTER — Other Ambulatory Visit: Payer: Self-pay | Admitting: Family Medicine

## 2023-01-20 DIAGNOSIS — M961 Postlaminectomy syndrome, not elsewhere classified: Secondary | ICD-10-CM | POA: Diagnosis not present

## 2023-01-20 DIAGNOSIS — G90529 Complex regional pain syndrome I of unspecified lower limb: Secondary | ICD-10-CM | POA: Diagnosis not present

## 2023-01-20 DIAGNOSIS — E118 Type 2 diabetes mellitus with unspecified complications: Secondary | ICD-10-CM

## 2023-01-22 ENCOUNTER — Other Ambulatory Visit: Payer: Self-pay | Admitting: Family Medicine

## 2023-01-22 DIAGNOSIS — E118 Type 2 diabetes mellitus with unspecified complications: Secondary | ICD-10-CM

## 2023-01-27 ENCOUNTER — Ambulatory Visit (INDEPENDENT_AMBULATORY_CARE_PROVIDER_SITE_OTHER): Payer: Medicare Other | Admitting: Sports Medicine

## 2023-01-27 ENCOUNTER — Ambulatory Visit (INDEPENDENT_AMBULATORY_CARE_PROVIDER_SITE_OTHER): Payer: Medicare Other

## 2023-01-27 DIAGNOSIS — M1712 Unilateral primary osteoarthritis, left knee: Secondary | ICD-10-CM

## 2023-01-27 NOTE — Assessment & Plan Note (Signed)
Pleasant 67 year old female, last knee injection was in December, bilateral, she has known osteoarthritis with intra-articular loose bodies and degenerative meniscal tearing. Visco was approved but it was too expensive. Repeat bilateral steroid injection today. Return to see me as needed.

## 2023-01-27 NOTE — Progress Notes (Signed)
    Procedures performed today:    Procedure: Real-time Ultrasound Guided injection of the left knee Device: Samsung HS60  Verbal informed consent obtained.  Time-out conducted.  Noted no overlying erythema, induration, or other signs of local infection.  Skin prepped in a sterile fashion.  Local anesthesia: Topical Ethyl chloride.  With sterile technique and under real time ultrasound guidance: Mild effusion noted, 1 cc Kenalog 40, 2 cc lidocaine, 2 cc bupivacaine injected easily Completed without difficulty  Advised to call if fevers/chills, erythema, induration, drainage, or persistent bleeding.  Images permanently stored and available for review in PACS.  Impression: Technically successful ultrasound guided injection.   Procedure: Real-time Ultrasound Guided injection of the right knee Device: Samsung HS60  Verbal informed consent obtained.  Time-out conducted.  Noted no overlying erythema, induration, or other signs of local infection.  Skin prepped in a sterile fashion.  Local anesthesia: Topical Ethyl chloride.  With sterile technique and under real time ultrasound guidance: Mild effusion noted, 1 cc Kenalog 40, 2 cc lidocaine, 2 cc bupivacaine injected easily Completed without difficulty  Advised to call if fevers/chills, erythema, induration, drainage, or persistent bleeding.  Images permanently stored and available for review in PACS.  Impression: Technically successful ultrasound guided injection.  Independent interpretation of notes and tests performed by another provider:   None.  Brief History, Exam, Impression, and Recommendations:    Osteoarthritis of left knee, degenerative medial meniscal tear, intra-articular loose bodies. Pleasant 67 year old female, last knee injection was in December, bilateral, she has known osteoarthritis with intra-articular loose bodies and degenerative meniscal tearing. Visco was approved but it was too expensive. Repeat bilateral  steroid injection today. Return to see me as needed.    ____________________________________________ Gwen Her. Dianah Field, M.D., ABFM., CAQSM., AME. Primary Care and Sports Medicine [facility]  Adjunct Professor of [employer] [facility] [employer]  Risk manager

## 2023-02-03 DIAGNOSIS — G8929 Other chronic pain: Secondary | ICD-10-CM | POA: Diagnosis not present

## 2023-02-03 DIAGNOSIS — M961 Postlaminectomy syndrome, not elsewhere classified: Secondary | ICD-10-CM | POA: Diagnosis not present

## 2023-02-03 DIAGNOSIS — M25561 Pain in right knee: Secondary | ICD-10-CM | POA: Diagnosis not present

## 2023-02-03 DIAGNOSIS — G90529 Complex regional pain syndrome I of unspecified lower limb: Secondary | ICD-10-CM | POA: Diagnosis not present

## 2023-02-03 DIAGNOSIS — M25562 Pain in left knee: Secondary | ICD-10-CM | POA: Diagnosis not present

## 2023-02-06 DIAGNOSIS — J449 Chronic obstructive pulmonary disease, unspecified: Secondary | ICD-10-CM | POA: Diagnosis not present

## 2023-02-12 ENCOUNTER — Other Ambulatory Visit: Payer: Self-pay | Admitting: Family Medicine

## 2023-02-12 DIAGNOSIS — R0982 Postnasal drip: Secondary | ICD-10-CM

## 2023-03-04 ENCOUNTER — Other Ambulatory Visit: Payer: Self-pay | Admitting: Family Medicine

## 2023-03-04 DIAGNOSIS — E1165 Type 2 diabetes mellitus with hyperglycemia: Secondary | ICD-10-CM

## 2023-03-05 ENCOUNTER — Ambulatory Visit: Payer: Medicare Other | Admitting: Family Medicine

## 2023-03-05 NOTE — Progress Notes (Deleted)
   Established Patient Office Visit  Subjective   Patient ID: Michelle Rodgers, female    DOB: [birth year]  Age: 67 y.o. MRN: [id number]  No chief complaint on file.   HPI  {History (Optional):23778}  ROS    Objective:     There were no vitals taken for this visit. {Vitals History (Optional):23777}  Physical Exam   No results found for any visits on 03/05/23.  {Labs (Optional):23779}  The 10-year ASCVD risk score (Arnett DK, et al., 2019) is: 16.4%    Assessment & Plan:   Problem List Items Addressed This Visit       Endocrine   Controlled diabetes mellitus type 2 with complications - Primary    No follow-ups on file.    Nani Gasser, MD

## 2023-03-11 ENCOUNTER — Telehealth: Payer: Self-pay | Admitting: Family Medicine

## 2023-03-11 ENCOUNTER — Other Ambulatory Visit: Payer: Medicare Other | Admitting: Pharmacist

## 2023-03-11 DIAGNOSIS — G2581 Restless legs syndrome: Secondary | ICD-10-CM

## 2023-03-11 NOTE — Telephone Encounter (Signed)
Is call mail order pharmacy and find out what is going on and why they have not filled her ropinirole we had sent prescriptions for both doses back in January for 97-month supply but patient says she is having a hard time getting the medication.

## 2023-03-11 NOTE — Progress Notes (Signed)
03/11/2023 Name: Michelle Rodgers MRN: [id number] DOB: [birth year]   Michelle Rodgers is a 67 y.o. year old female who presented for a telephone visit.   They were referred to the pharmacist by a quality report for assistance in managing  quality metric: med adherence diabetes (MAD) .   Subjective:  Care Team: Primary Care Provider: Agapito Games, MD   Medication Access/Adherence  Current Pharmacy:  [facility] - [place], [place] - [address] [place] [address] Phone: [phone] Fax: [phone]  [phone] [place], [place] - [address] [facility] [address] [place] [address] Phone: [phone] Fax: [phone]   Patient reports affordability concerns with their medications: Yes synjardy puts patient into donut hole, and trelegy not taking/filling RX due to cost Patient reports access/transportation concerns to their pharmacy: No  Patient reports adherence concerns with their medications:  No  aside from cost barrier  Diabetes:  Current medications: synjardy 12.5-1000mg  daily Medications tried in the past:   Current glucose readings: 111, 135, two episodes of lower (75,65) was asymptomatic and resolved with food intake Using traditional meter; testing 2-3 times daily   Patient denies hypoglycemic s/sx including dizziness, shakiness, sweating. Patient denies hyperglycemic symptoms including polyuria, polydipsia, polyphagia, nocturia, neuropathy, blurred vision.   Current medication access support: none at present   Objective:  Lab Results  Component Value Date   HGBA1C 12.3 12/14/2022    Lab Results  Component Value Date   CREATININE 1.00 12/14/2022   BUN 15 12/14/2022   NA 134 (L) 12/14/2022   K 4.3 12/14/2022   CL 97 (L) 12/14/2022   CO2 29 12/14/2022    Lab Results  Component Value Date   CHOL 142 09/09/2022   HDL 45 (L) 09/09/2022    LDLCALC 71 09/09/2022   TRIG 180 (H) 09/09/2022   CHOLHDL 3.2 09/09/2022    Medications Reviewed Today     Reviewed by Carren Rang, CMA (Certified Medical Assistant) on 01/27/23 at [phone]  Med List Status: <None>   Medication Order Taking? Sig Documenting Provider Last Dose Status Informant  Accu-Chek Softclix Lancets lancets [id number] Yes 1 EACH BY DOES NOT APPLY ROUTE IN THE MORNING, AT NOON, AND AT BEDTIME. E11.8 Agapito Games, MD Taking Active   albuterol (VENTOLIN HFA) 108 (90 Base) MCG/ACT inhaler [id number] Yes Inhale 2 puffs into the lungs as needed. [provider] Taking Active   atorvastatin (LIPITOR) 20 MG tablet [id number] Yes Take 20 mg by mouth at bedtime. Luiz Iron, MD Taking Active   Blood Glucose Monitoring Suppl DEVI [id number] Yes 1 each by Does not apply route in the morning, at noon, and at bedtime. May substitute to any manufacturer covered by patient's insurance.E11.8 Agapito Games, MD Taking Active   clonazePAM Scarlette Calico) 0.5 MG tablet [id number] Yes Take 1 tablet (0.5 mg total) by mouth daily as needed. Agapito Games, MD Taking Active   diclofenac Sodium (VOLTAREN) 1 % GEL [id number] Yes Apply 1 Application topically 4 (four) times daily. Luiz Iron, MD Taking Active   DULoxetine (CYMBALTA) 60 MG capsule [id number] Yes Take 60 mg by mouth daily. Jory Ee Taking Active   Empagliflozin-metFORMIN HCl (SYNJARDY) 12.03-999 MG TABS [id number] Yes Take 1 tablet by mouth 2 (two) times daily. Agapito Games, MD Taking Active   Fluticasone-Umeclidin-[facility] ELLIPTA) 200-62.5-25 MCG/ACT AEPB [id number] Yes Inhale into the lungs. Bujold,  Rex Kras, MD Taking Active   folic acid (FOLVITE) 1 MG tablet [id number] Yes Take 1 mg by mouth daily. Jory Ee Taking Active   furosemide (LASIX) 20 MG tablet [id number] Yes Take by mouth. Luiz Iron, MD Taking Active   gabapentin (NEURONTIN) 300 MG capsule [id number] Yes Take  1 capsule (300 mg total) by mouth 2 (two) times daily. Agapito Games, MD Taking Active   glucose blood (ACCU-CHEK GUIDE) test strip [id number] Yes 1 EACH BY IN VITRO ROUTE IN THE MORNING, AT NOON, AND AT BEDTIME.E11.8 Agapito Games, MD Taking Active   ipratropium (ATROVENT) 0.03 % nasal spray [id number] Yes Place 2 sprays into both nostrils every 12 (twelve) hours. Agapito Games, MD Taking Active   magnesium oxide (MAG-OX) 400 MG tablet [id number] Yes Take by mouth. [provider] Taking Active   meloxicam (MOBIC) 7.5 MG tablet [id number] Yes One by mouth every 12 hours as needed for pain Agapito Games, MD Taking Active   montelukast (SINGULAIR) 10 MG tablet [id number] Yes Take 10 mg by mouth daily. [provider] Taking Active   omeprazole (PRILOSEC) 40 MG capsule [id number] Yes omeprazole 40 mg capsule,delayed release  TAKE 1 CAPSULE BY MOUTH EVERY DAY Bujold, Rex Kras, MD Taking Active   rOPINIRole (REQUIP) 3 MG tablet [id number] Yes Take 1 tablet (3 mg total) by mouth in the morning. Agapito Games, MD Taking Active   rOPINIRole (REQUIP) 4 MG tablet [id number] Yes Take 1 tablet (4 mg total) by mouth at bedtime. Agapito Games, MD Taking Active   spironolactone (ALDACTONE) 100 MG tablet [id number] Yes One by mouth daily E11.8 Agapito Games, MD Taking Active   XTAMPZA ER 36 MG C12A [id number] Yes Take 1 capsule by mouth 2 (two) times daily. [provider] Taking Active               Assessment/Plan:   Diabetes: - Currently uncontrolled per A1c but likely improving based on recent blood sugars - Recommend to continue current regimen, will remove cost barrier  - Recommend to check glucose 2-3 times as current regimen - Meets financial criteria for synjardy patient assistance program through [facility]. Will collaborate with provider, CPhT, and patient to pursue assistance.   - Of note, also screened for LIS Extra  Help, not eligible based on income - However, recommend trelegy switch to breztri for cost assistance through [employer]. Will facilitate with provider   Follow Up Plan: 1 month to ensure applications process smoothly and provide ongoing med access support  Lynnda Shields, PharmD, BCPS Clinical Pharmacist [facility]

## 2023-03-11 NOTE — Telephone Encounter (Signed)
-----   Message from Gabriel Carina, [facility] sent at 03/11/2023  1:14 PM EDT ----- Yes! I encouraged her to call the mail order because it had an available refill, she states she had trouble and [facility]. She stated the [facility] "was only used when Dr. Linford Arnold tried to get glucometer through them but they didn't cover it well"   I can ask her for more details if you need - just let me know!  Also saw your response on Markus Daft - will get that application going through central RX assistance team and keep you posted for any RX needs. For now will do the application first.  A lot of these are moving to online applications - which is SO great! But each company is doing their RX needs a little differently so everyone is still learning the processes for that.  ----- Message ----- From: Agapito Games, MD Sent: 03/11/2023  12:47 PM EDT To: Gabriel Carina, RPH  Both of her ropinirole rx  prescriptions were sent to mail order in March so she should be good until September.  Is she switching pharmacies? ----- Message ----- From: Gabriel Carina, [facility] Sent: 03/11/2023  11:58 AM EDT To: Agapito Games, MD  Ah, I am so sorry to double send - her ropinirole  [facility], she will run out prior to her upcoming office visit with you. THanks

## 2023-03-12 ENCOUNTER — Other Ambulatory Visit (HOSPITAL_COMMUNITY): Payer: Self-pay

## 2023-03-12 ENCOUNTER — Telehealth: Payer: Self-pay

## 2023-03-12 MED ORDER — ROPINIROLE HCL 3 MG PO TABS: 3.0000 mg | ORAL_TABLET | Freq: Every morning | ORAL | 1 refills | Status: DC

## 2023-03-12 MED ORDER — ROPINIROLE HCL 4 MG PO TABS
4.0000 mg | ORAL_TABLET | Freq: Every day | ORAL | 1 refills | Status: DC
Start: 2023-03-12 — End: 2023-03-19

## 2023-03-12 NOTE — Telephone Encounter (Signed)
Pt enrolled in [employer]Me pt assistance for Breztri inhaler until 11/16/2023. Medication will be shipped to pt's home in 10-14 business days upon receipt of new rx. Faxed to provider to e-scribe to AZ[employer]Me.   Pt also requires assistance for Kirk Ruth[facility], will mail out application in following business week.

## 2023-03-12 NOTE — Telephone Encounter (Signed)
I called [facility] and they do not have a prescription for the Requip. I have sent in both. I will call on Monday to check to make sure it went through.

## 2023-03-15 NOTE — Telephone Encounter (Signed)
Pt portion of PAP app sent to pt's address.

## 2023-03-17 ENCOUNTER — Other Ambulatory Visit: Payer: Self-pay | Admitting: Pharmacist

## 2023-03-17 MED ORDER — BREZTRI AEROSPHERE 160-9-4.8 MCG/ACT IN AERO: 2.0000 | INHALATION_SPRAY | Freq: Two times a day (BID) | RESPIRATORY_TRACT | 11 refills | Status: DC

## 2023-03-17 NOTE — Addendum Note (Signed)
Addended by: Gabriel Carina on: 03/17/2023 11:12 AM   Modules accepted: Orders

## 2023-03-17 NOTE — Progress Notes (Signed)
Care Coordination Call  Completed care coordination for [facility] patient assistance program, facilitated eRX to program for processing and delivery to patients home within 10-14 days of receiving RX.   Attempted to contact patient to give her an update of this as well, no answer and unable to leave voicemail due to phone not accepting voicemail.  Patient is also receiving synjardy application in mail, will continue to follow and support patient for medication access needs.  Lynnda Shields, PharmD, BCPS Clinical Pharmacist [facility]

## 2023-03-19 MED ORDER — ROPINIROLE HCL 3 MG PO TABS
3.0000 mg | ORAL_TABLET | Freq: Every morning | ORAL | 1 refills | Status: DC
Start: 2023-03-19 — End: 2023-09-27

## 2023-03-19 MED ORDER — ROPINIROLE HCL 4 MG PO TABS
4.0000 mg | ORAL_TABLET | Freq: Every day | ORAL | 1 refills | Status: DC
Start: 2023-03-19 — End: 2023-07-15

## 2023-03-19 NOTE — Telephone Encounter (Signed)
I called Loura and she states she doesn't use [facility]. [facility].

## 2023-03-19 NOTE — Addendum Note (Signed)
Addended by: Chalmers Cater on: 03/19/2023 09:20 AM   Modules accepted: Orders

## 2023-03-23 ENCOUNTER — Encounter: Payer: Self-pay | Admitting: Family Medicine

## 2023-03-23 ENCOUNTER — Ambulatory Visit (INDEPENDENT_AMBULATORY_CARE_PROVIDER_SITE_OTHER): Payer: Medicare Other | Admitting: Family Medicine

## 2023-03-23 VITALS — BP 133/67 | HR 88 | Ht 65.0 in | Wt 241.0 lb

## 2023-03-23 DIAGNOSIS — M069 Rheumatoid arthritis, unspecified: Secondary | ICD-10-CM

## 2023-03-23 DIAGNOSIS — J418 Mixed simple and mucopurulent chronic bronchitis: Secondary | ICD-10-CM | POA: Diagnosis not present

## 2023-03-23 DIAGNOSIS — E118 Type 2 diabetes mellitus with unspecified complications: Secondary | ICD-10-CM

## 2023-03-23 DIAGNOSIS — Z6841 Body Mass Index (BMI) 40.0 and over, adult: Secondary | ICD-10-CM

## 2023-03-23 DIAGNOSIS — Z7984 Long term (current) use of oral hypoglycemic drugs: Secondary | ICD-10-CM

## 2023-03-23 LAB — POCT GLYCOSYLATED HEMOGLOBIN (HGB A1C): Hemoglobin A1C: 6.6 % — AB (ref 4.0–5.6)

## 2023-03-23 LAB — CBC WITH DIFFERENTIAL/PLATELET
Basophils Absolute: 45 cells/uL (ref 0–200)
Lymphs Abs: 3537 cells/uL (ref 850–3900)
Monocytes Relative: 6 %
RDW: 12.1 % (ref 11.0–15.0)
Total Lymphocyte: 31.3 %
WBC: 11.3 10*3/uL — ABNORMAL HIGH (ref 3.8–10.8)

## 2023-03-23 MED ORDER — CLONAZEPAM 0.5 MG PO TABS: 0.5000 mg | ORAL_TABLET | Freq: Every day | ORAL | 0 refills | Status: DC | PRN

## 2023-03-23 MED ORDER — PREDNISONE 10 MG PO TABS
ORAL_TABLET | ORAL | 0 refills | Status: AC
Start: 2023-03-23 — End: 2023-04-12

## 2023-03-23 NOTE — Assessment & Plan Note (Signed)
Continue to work on healthy diet and exercise.   

## 2023-03-23 NOTE — Assessment & Plan Note (Signed)
Recently switched to [facility] and try to get patient assistance for that as well.  That should help as well.  The hopefully should but out in the next week.

## 2023-03-23 NOTE — Assessment & Plan Note (Signed)
Will tx with prednisone taper for short term. Will try to place Rheum referral again. Will get updated labs.

## 2023-03-23 NOTE — Progress Notes (Signed)
Established Patient Office Visit  Subjective   Patient ID: Michelle Rodgers, female    DOB: [birth year]  Age: 67 y.o. MRN: [id number]  Chief Complaint  Patient presents with   Diabetes    HPI Diabetes - no hypoglycemic events. No wounds or sores that are not healing well. No increased thirst or urination. Checking glucose at home. Taking medications as prescribed without any side effects.  Already has been working well but it has been quite costly since she had her Medicare gap but she is can apply for patient assistance.  Also having a flar with her rheumatoid.  Before she moved back she had just been diagnosed with rheumatoid.  They were trying to get a biologic approved but it was too costly and then she moved back to the area we have tried referred to rheumatology back in the fall but is when her husband was sick and ended up in the hospital for couple of months.  So she was never able to establish with them she is still taking her methotrexate but she has been in a flare with her hands left wrist and left elbow for about a month at this point.  He recently had to change her pain management regimen.  The Marlowe Kays was going to cost $300 when she had her Medicare gap so they have switched her medications at least for now.    ROS    Objective:     BP 133/67   Pulse 88   Ht 5\' 5"  (1.651 m)   Wt 241 lb (109.3 kg)   SpO2 95%   BMI 40.10 kg/m    Physical Exam     Results for orders placed or performed in visit on 03/23/23  POCT glycosylated hemoglobin (Hb A1C)  Result Value Ref Range   Hemoglobin A1C 6.6 (A) 4.0 - 5.6 %   HbA1c POC (<> result, manual entry)     HbA1c, POC (prediabetic range)     HbA1c, POC (controlled diabetic range)        The 10-year ASCVD risk score (Arnett DK, et al., 2019) is: 18.3%    Assessment & Plan:   Problem List Items Addressed This Visit       Respiratory   Mixed simple and mucopurulent chronic bronchitis (HCC)    Recently switched to  [facility] and try to get patient assistance for that as well.  That should help as well.  The hopefully should but out in the next week.        Endocrine   Controlled diabetes mellitus type 2 with complications (HCC) - Primary    a1C looks great today.  We did print out the [employer] forms for her to complete.      Relevant Orders   POCT glycosylated hemoglobin (Hb A1C) (Completed)   COMPLETE METABOLIC PANEL WITH GFR     Musculoskeletal and Integument   Rheumatoid arthritis involving both hands (HCC)    Will tx with prednisone taper for short term. Will try to place Rheum referral again. Will get updated labs.       Relevant Medications   predniSONE (DELTASONE) 10 MG tablet   Other Relevant Orders   Sedimentation rate   C-reactive protein   Cyclic citrul peptide antibody, IgG   COMPLETE METABOLIC PANEL WITH GFR   CBC with Differential/Platelet   Folate     Other   BMI 40.0-44.9, adult (HCC)    Continue to work on healthy diet and exercise.  No follow-ups on file.    Beatrice Lecher, MD

## 2023-03-23 NOTE — Assessment & Plan Note (Addendum)
a1C looks great today.  We did print out the [employer] forms for her to complete.

## 2023-03-24 LAB — SEDIMENTATION RATE: Sed Rate: 34 mm/h — ABNORMAL HIGH (ref 0–30)

## 2023-03-24 LAB — CBC WITH DIFFERENTIAL/PLATELET
Absolute Monocytes: 678 cells/uL (ref 200–950)
Eosinophils Absolute: 192 cells/uL (ref 15–500)
HCT: 47.9 % — ABNORMAL HIGH (ref 35.0–45.0)
MCV: 96.4 fL (ref 80.0–100.0)

## 2023-03-24 LAB — COMPLETE METABOLIC PANEL WITH GFR
AG Ratio: 1.4 (calc) (ref 1.0–2.5)
CO2: 29 mmol/L (ref 20–32)
Calcium: 9.7 mg/dL (ref 8.6–10.4)
Chloride: 99 mmol/L (ref 98–110)
Sodium: 138 mmol/L (ref 135–146)
Total Bilirubin: 0.3 mg/dL (ref 0.2–1.2)

## 2023-03-24 NOTE — Progress Notes (Signed)
Hi Kloe, the [facility] panel looks good.  Hemoglobin still elevated but not as high as it was 6 months ago which is reassuring.  This is secondary to smoking.  Encouraged her to continue to work on cutting back.  Labs still pending.  Please try to schedule your mammogram and bone density when you get a chance they can do them on the same day.

## 2023-03-25 ENCOUNTER — Telehealth: Payer: Self-pay | Admitting: Pharmacist

## 2023-03-25 LAB — COMPLETE METABOLIC PANEL WITH GFR
ALT: 9 U/L (ref 6–29)
AST: 10 U/L (ref 10–35)
Albumin: 4.2 g/dL (ref 3.6–5.1)
Alkaline phosphatase (APISO): 65 U/L (ref 37–153)
BUN: 24 mg/dL (ref 7–25)
Creat: 0.74 mg/dL (ref 0.50–1.05)
Globulin: 2.9 g/dL (calc) (ref 1.9–3.7)
Glucose, Bld: 103 mg/dL — ABNORMAL HIGH (ref 65–99)
Potassium: 4.8 mmol/L (ref 3.5–5.3)
Total Protein: 7.1 g/dL (ref 6.1–8.1)
eGFR: 90 mL/min/{1.73_m2} (ref >=60)

## 2023-03-25 LAB — CBC WITH DIFFERENTIAL/PLATELET
Basophils Relative: 0.4 %
Eosinophils Relative: 1.7 %
Hemoglobin: 16.2 g/dL — ABNORMAL HIGH (ref 11.7–15.5)
MCH: 32.6 pg (ref 27.0–33.0)
MCHC: 33.8 g/dL (ref 32.0–36.0)
MPV: 9.5 fL (ref 7.5–12.5)
Neutro Abs: 6848 cells/uL (ref 1500–7800)
Neutrophils Relative %: 60.6 %
Platelets: 374 10*3/uL (ref 140–400)
RBC: 4.97 10*6/uL (ref 3.80–5.10)

## 2023-03-25 LAB — FOLATE: Folate: >24 ng/mL

## 2023-03-25 LAB — CYCLIC CITRUL PEPTIDE ANTIBODY, IGG: Cyclic Citrullin Peptide Ab: 39 UNITS — ABNORMAL HIGH

## 2023-03-25 LAB — C-REACTIVE PROTEIN: CRP: 14.7 mg/L — ABNORMAL HIGH (ref <8.0)

## 2023-03-25 NOTE — Progress Notes (Signed)
Care Coordination Call  Outreached patient to reschedule upcoming phone appointment due to provider conflict. Patient did not answer, unable to leave voicemail. Will reschedule phone appointment and attempt outreach at that time.  Lynnda Shields, PharmD, BCPS Clinical Pharmacist [facility]

## 2023-03-26 NOTE — Progress Notes (Signed)
Hi Kahlan, do we need to put in a new rheumatology referral for you?  I was not sure if you were just going to try to call the office and get back in and reschedule or if we need to go ahead and place a new referral?  Please just let us know.

## 2023-03-30 ENCOUNTER — Other Ambulatory Visit: Payer: Medicare Other | Admitting: Pharmacist

## 2023-04-01 ENCOUNTER — Other Ambulatory Visit: Payer: Medicare Other | Admitting: Pharmacist

## 2023-04-01 ENCOUNTER — Telehealth: Payer: Self-pay | Admitting: Family Medicine

## 2023-04-01 NOTE — Telephone Encounter (Signed)
[name] dropped off paperwork for Patient: Michelle Rodgers, from [facility] Patient Assistance Program and I placed in Dr.Metheney's basket

## 2023-04-01 NOTE — Progress Notes (Signed)
04/01/2023 Name: Michelle Rodgers MRN: [id number] DOB: Jan 15, 1956   Michelle Rodgers is a 67 y.o. year old female who presented for a telephone visit.   They were referred to the pharmacist by a quality report for assistance in managing  quality metric: med adherence diabetes (MAD) .   Subjective:  Care Team: Primary Care Provider: Agapito Games, MD   Medication Access/Adherence  Current Pharmacy:  [facility] - [place], [place] - [address] [place] [address] Phone: [phone] Fax: [phone]  [phone] [address] [place], [place] - [address] [place] [address] Phone: [phone] Fax: [phone]  [facility] - [place], [place] - [address] [place] [address] Phone: [phone] Fax: [phone]   Patient reports affordability concerns with their medications: Yes synjardy puts patient into donut hole, and trelegy not taking/filling RX due to cost Patient reports access/transportation concerns to their pharmacy: No  Patient reports adherence concerns with their medications:  No  aside from cost barrier  Diabetes:  Current medications: synjardy 12.5-1000mg  daily  Current glucose readings: 111, 135, two episodes of lower (75,65) was asymptomatic and resolved with food intake Using traditional meter; testing 2-3 times daily   Patient denies hypoglycemic s/sx including dizziness, shakiness, sweating. Patient denies hyperglycemic symptoms including polyuria, polydipsia, polyphagia, nocturia, neuropathy, blurred vision.   Current medication access support: synjardy in process   Objective:  Lab Results  Component Value Date   HGBA1C 6.6 (A) 03/23/2023    Lab Results  Component Value Date   CREATININE 0.74 03/23/2023   BUN 24 03/23/2023   NA 138 03/23/2023   K 4.8 03/23/2023   CL 99 03/23/2023   CO2 29  03/23/2023    Lab Results  Component Value Date   CHOL 142 09/09/2022   HDL 45 (L) 09/09/2022   LDLCALC 71 09/09/2022   TRIG 180 (H) 09/09/2022   CHOLHDL 3.2 09/09/2022    Medications Reviewed Today     Reviewed by Gabriel Carina, RPH (Pharmacist) on 04/01/23 at 2224  Med List Status: <None>   Medication Order Taking? Sig Documenting Provider Last Dose Status Informant  Accu-Chek Softclix Lancets lancets [id number]  1 EACH BY DOES NOT APPLY ROUTE IN THE MORNING, AT NOON, AND AT BEDTIME. E11.8 Agapito Games, MD  Active   albuterol (VENTOLIN HFA) 108 (90 Base) MCG/ACT inhaler [id number]  Inhale 2 puffs into the lungs as needed. [provider]  Active   atorvastatin (LIPITOR) 20 MG tablet [id number]  Take 20 mg by mouth at bedtime. Luiz Iron, MD  Active   Blood Glucose Monitoring Suppl DEVI [id number]  1 each by Does not apply route in the morning, at noon, and at bedtime. May substitute to any manufacturer covered by patient's insurance.E11.8 Agapito Games, MD  Active   Budeson-Glycopyrrol-Formoterol (BREZTRI AEROSPHERE) 160-9-4.8 MCG/ACT Sandrea Matte [id number] Yes Inhale 2 puffs into the lungs 2 (two) times daily. Agapito Games, MD Taking Active   clonazePAM Scarlette Calico) 0.5 MG tablet [id number]  Take 1 tablet (0.5 mg total) by mouth daily as needed. Agapito Games, MD  Active   diclofenac Sodium (VOLTAREN) 1 % GEL [id number]  Apply 1 Application topically 4 (four) times daily. Luiz Iron, MD  Active   DULoxetine (CYMBALTA) 60 MG capsule [id number]  Take 60 mg by mouth daily. Paquin,  Alecia Lemming  Active   folic acid (FOLVITE) 1 MG tablet [id number]  Take 1 mg by mouth daily. Jory Ee  Active   furosemide (LASIX) 20 MG tablet [id number]  Take by mouth. Luiz Iron, MD  Active   gabapentin (NEURONTIN) 300 MG capsule [id number]  Take 1 capsule (300 mg total) by mouth 2 (two) times daily. Agapito Games, MD  Active   glucose blood (ACCU-CHEK  GUIDE) test strip [id number]  1 EACH BY IN VITRO ROUTE IN THE MORNING, AT NOON, AND AT BEDTIME.E11.8 Agapito Games, MD  Active   ipratropium (ATROVENT) 0.03 % nasal spray [id number]  PLACE 2 SPRAYS INTO BOTH NOSTRILS EVERY 12 (TWELVE) HOURS. Agapito Games, MD  Active   magnesium oxide (MAG-OX) 400 MG tablet [id number]  Take by mouth. [provider]  Active   meloxicam (MOBIC) 7.5 MG tablet [id number]  One by mouth every 12 hours as needed for pain Agapito Games, MD  Active   omeprazole (PRILOSEC) 40 MG capsule [id number]  omeprazole 40 mg capsule,delayed release  TAKE 1 CAPSULE BY MOUTH EVERY DAY Bujold, Rex Kras, MD  Active   oxyCODONE-acetaminophen (PERCOCET) 10-325 MG tablet [id number]  Take 1.5 tablets by mouth every 6 (six) hours as needed for pain. Up to 6 tablets a day. Managed by pain doctor [provider]  Active   predniSONE (DELTASONE) 10 MG tablet [id number]  Take 4 tablets (40 mg total) by mouth daily with breakfast for 4 days, THEN 3 tablets (30 mg total) daily with breakfast for 4 days, THEN 2 tablets (20 mg total) daily with breakfast for 4 days, THEN 1 tablet (10 mg total) daily with breakfast for 4 days, THEN 0.5 tablets (5 mg total) daily with breakfast for 4 days. Agapito Games, MD  Active   rOPINIRole (REQUIP) 3 MG tablet [id number]  Take 1 tablet (3 mg total) by mouth in the morning. Agapito Games, MD  Active   rOPINIRole (REQUIP) 4 MG tablet [id number]  Take 1 tablet (4 mg total) by mouth at bedtime. Agapito Games, MD  Active   spironolactone (ALDACTONE) 100 MG tablet [id number]  One by mouth daily E11.8 Agapito Games, MD  Active   SYNJARDY 12.03-999 MG TABS [id number]  TAKE 1 TABLET BY MOUTH TWICE A DAY Agapito Games, MD  Active               Assessment/Plan:   Diabetes: - Currently uncontrolled per A1c but likely improving based on recent blood sugars - Recommend to continue current regimen,  will remove cost barrier  - Recommend to check glucose 2-3 times as current regimen - Patient is dropping off synjardy forms to office 04/02/23, will process and submit to company. - Meets financial criteria for synjardy patient assistance program through [facility]. Will collaborate with provider, CPhT, and patient to pursue assistance.   - Of note, also screened for LIS Extra Help, not eligible based on income - Successfully switched from trellegy to breztri via [employer]Me patient assistance to remove cost barrier for patient. Received the inhalers in the mail 03/31/23, we reviewed inhaler technique and dosing, all is going well on starting the breztri.   Follow Up Plan: 1 month to ensure applications process smoothly and provide ongoing med access support  Lynnda Shields, PharmD, BCPS Clinical Pharmacist [facility]

## 2023-04-02 NOTE — Telephone Encounter (Signed)
HI Michelle Rodgers,  Patient's dropped these forms off what is the best way to get them to your pharmacy tech team that can help with these forms?  Or should we just send them and fax them from here?  Just was not sure what the best way of tracking all that would be.  Either way is fine I just want to make sure that were doing the right thing for the patient.

## 2023-04-02 NOTE — Telephone Encounter (Signed)
Encounter signed and closed

## 2023-04-02 NOTE — Telephone Encounter (Signed)
Pt forms faxed, confirmation received and scanned into pt's chart.

## 2023-04-02 NOTE — Progress Notes (Signed)
Care Coordination  For sending patient assistance forms to the RX Medication Assistance team, you can fax to [phone].   They compile the paperwork and send to company - you are welcome to continue sending directly from your office if desired, but I do recommend utilizing the RX Medication Assistance Team because they will also help "own" the process for renewals when needed!   Thanks so much, Elmarie Shiley, PharmD, BCPS Clinical Pharmacist [facility]

## 2023-04-14 ENCOUNTER — Telehealth: Payer: Self-pay | Admitting: Family Medicine

## 2023-04-14 MED ORDER — MONTELUKAST SODIUM 10 MG PO TABS: 10.0000 mg | ORAL_TABLET | Freq: Every day | ORAL | 3 refills | Status: AC

## 2023-04-14 MED ORDER — FUROSEMIDE 20 MG PO TABS: 20.0000 mg | ORAL_TABLET | Freq: Every day | ORAL | 0 refills | Status: DC

## 2023-04-14 NOTE — Telephone Encounter (Signed)
Patient called requesting a refill of ; Lasix 20mg , Montelukant 10mg  singular  [facility] [address] [facility] [employer] [place]

## 2023-04-26 NOTE — Telephone Encounter (Signed)
Correct, for Synjardy.

## 2023-04-26 NOTE — Telephone Encounter (Signed)
Saw in separate encounters that office faxed application to company. Pt has been approved as of 04/02/2023, until 11/16/2023. Medication has already been delivered and next refill will ship again 07/09/2023. Asked [employer] rep to fax approval letter, will attach to chart when received.

## 2023-04-28 ENCOUNTER — Other Ambulatory Visit: Payer: Self-pay | Admitting: Family Medicine

## 2023-05-07 ENCOUNTER — Telehealth: Payer: Self-pay

## 2023-05-07 NOTE — Progress Notes (Signed)
   05/07/2023  Patient ID: Michelle Rodgers, female   DOB: 05-30-1956, 67 y.o.   MRN: [id number]  Patient outreach to follow-up on PAP supplied [place] and [place].    -Received 6 months of Breztri and 3 months of Synjardy -States FBG 90-130 -Endorses tolerating medications well with no needs at this time  Lenna Gilford, PharmD, DPLA

## 2023-05-14 ENCOUNTER — Other Ambulatory Visit: Payer: Self-pay | Admitting: Family Medicine

## 2023-06-11 ENCOUNTER — Other Ambulatory Visit: Payer: Self-pay | Admitting: Family Medicine

## 2023-06-14 ENCOUNTER — Other Ambulatory Visit: Payer: Self-pay | Admitting: *Deleted

## 2023-06-14 MED ORDER — OMEPRAZOLE 40 MG PO CPDR: 40.0000 mg | DELAYED_RELEASE_CAPSULE | Freq: Every day | ORAL | 3 refills | Status: AC

## 2023-06-14 MED ORDER — ATORVASTATIN CALCIUM 20 MG PO TABS: 20.0000 mg | ORAL_TABLET | Freq: Every day | ORAL | 3 refills | Status: DC

## 2023-07-06 ENCOUNTER — Ambulatory Visit: Payer: Medicare Other | Admitting: Family Medicine

## 2023-07-06 ENCOUNTER — Encounter: Payer: Self-pay | Admitting: Family Medicine

## 2023-07-06 VITALS — BP 132/60 | HR 85 | Ht 65.0 in | Wt 246.0 lb

## 2023-07-06 DIAGNOSIS — R600 Localized edema: Secondary | ICD-10-CM

## 2023-07-06 DIAGNOSIS — E118 Type 2 diabetes mellitus with unspecified complications: Secondary | ICD-10-CM

## 2023-07-06 DIAGNOSIS — Z23 Encounter for immunization: Secondary | ICD-10-CM | POA: Diagnosis not present

## 2023-07-06 DIAGNOSIS — R0602 Shortness of breath: Secondary | ICD-10-CM | POA: Diagnosis not present

## 2023-07-06 LAB — POCT GLYCOSYLATED HEMOGLOBIN (HGB A1C): Hemoglobin A1C: 6 % — AB (ref 4.0–5.6)

## 2023-07-06 MED ORDER — FUROSEMIDE 10 MG/ML IJ SOLN
20.0000 mg | Freq: Once | INTRAMUSCULAR | Status: AC
Start: 2023-07-06 — End: 2023-07-06
  Administered 2023-07-06: 20 mg via INTRAMUSCULAR

## 2023-07-06 NOTE — Assessment & Plan Note (Signed)
1C looks fantastic today.  Continue with healthy diet and staying active.

## 2023-07-06 NOTE — Progress Notes (Signed)
Acute Office Visit  Subjective:     Patient ID: Michelle Rodgers, female    DOB: 05/25/1956, 67 y.o.   MRN: [id number]  Chief Complaint  Patient presents with   Edema    Suzan Nailer     HPI  Patient is in today for LE edema x 3 weeks.  She says she has had swelling before but normally can get control of it especially if she bumps up her Lasix.  She is tried taking a tab twice a day and also tried taking 2 tabs at 1 time she just cannot seem to get it to go away.  Now her abdomen feels a little bit more full and tight.  That has happened before as well and she is noticing she is feeling a little bit more short of breath in the mornings.  She currently takes 20 mg of furosemide.  She denies any significant changes in diet.  No bump up in salt intake.  No recent chest pain.  Weight is usually  230 lbs at home, but is up to 246 lbs on home scale.  Not have a diagnosis of heart failure on her problem list but she was told at one time to monitor her weights daily.  ROS      Objective:    BP 132/60   Pulse 85   Ht 5\' 5"  (1.651 m)   Wt 246 lb (111.6 kg)   SpO2 96%   BMI 40.94 kg/m    Physical Exam Vitals and nursing note reviewed.  Constitutional:      Appearance: She is well-developed.  HENT:     Head: Normocephalic and atraumatic.  Cardiovascular:     Rate and Rhythm: Normal rate and regular rhythm.     Heart sounds: Normal heart sounds.  Pulmonary:     Effort: Pulmonary effort is normal.     Breath sounds: Normal breath sounds.  Skin:    General: Skin is warm and dry.     Comments: Plus pitting edema to lower extremities up to her knees.  2+ pitting over the tops of her feet.  Neurological:     Mental Status: She is alert and oriented to person, place, and time.  Psychiatric:        Behavior: Behavior normal.     Results for orders placed or performed in visit on 07/06/23  POCT glycosylated hemoglobin (Hb A1C)  Result Value Ref Range   Hemoglobin A1C 6.0 (A) 4.0 - 5.6 %    HbA1c POC (<> result, manual entry)     HbA1c, POC (prediabetic range)     HbA1c, POC (controlled diabetic range)          Assessment & Plan:   Problem List Items Addressed This Visit       Endocrine   Controlled diabetes mellitus type 2 with complications (HCC) - Primary    1C looks fantastic today.  Continue with healthy diet and staying active.      Relevant Orders   POCT glycosylated hemoglobin (Hb A1C) (Completed)   B Nat Peptide   CMP14+EGFR   TSH   CBC   Urine Microalbumin w/creat. ratio   Other Visit Diagnoses     Lower extremity edema       Relevant Medications   furosemide (LASIX) injection 20 mg (Completed)   furosemide (LASIX) injection 20 mg (Completed)   furosemide (LASIX) injection 20 mg (Completed)   furosemide (LASIX) injection 20 mg (Completed)   Other Relevant Orders  B Nat Peptide   CMP14+EGFR   TSH   CBC   Urine Microalbumin w/creat. ratio   SOB (shortness of breath)       Relevant Orders   B Nat Peptide   CMP14+EGFR   TSH   CBC   Urine Microalbumin w/creat. ratio      Lower extremity edema-will do some additional labs to work out other causes.  She denies any recent changes in diet or salt intake. Recommend women's knee-high compression stocking with a pressure around 20-25.  Up to 30 is okay as well. Give furosemide 80 mg IM today to try to trigger some diuresis.  Continue to track daily weights at home.  I want to see if we can get at least 12 pounds down by next Monday. To furosemide at the same time in the morning. Limit your total fluid intake to 50 ounces a day.  That includes coffee tea water etc. Check BNP to evaluate for possibility of heart failure.  I do question whether she may have had a prior diagnosis.  Meds ordered this encounter  Medications   furosemide (LASIX) injection 20 mg   furosemide (LASIX) injection 20 mg   furosemide (LASIX) injection 20 mg   furosemide (LASIX) injection 20 mg    No follow-ups on  file.  Nani Gasser, MD

## 2023-07-06 NOTE — Patient Instructions (Signed)
Recommend women's knee-high compression stocking with a pressure around 20-25.  Up to 30 is okay as well. To furosemide at the same time in the morning. Limit your total fluid intake to 50 ounces a day.  That includes coffee tea water etc.

## 2023-07-07 LAB — CMP14+EGFR
ALT: 12 IU/L (ref 0–32)
AST: 14 IU/L (ref 0–40)
Albumin: 4.2 g/dL (ref 3.9–4.9)
Alkaline Phosphatase: 76 IU/L (ref 44–121)
BUN/Creatinine Ratio: 20 (ref 12–28)
BUN: 16 mg/dL (ref 8–27)
Bilirubin Total: 0.3 mg/dL (ref 0.0–1.2)
CO2: 25 mmol/L (ref 20–29)
Calcium: 9.3 mg/dL (ref 8.7–10.3)
Chloride: 95 mmol/L — ABNORMAL LOW (ref 96–106)
Creatinine, Ser: 0.82 mg/dL (ref 0.57–1.00)
Globulin, Total: 2.4 g/dL (ref 1.5–4.5)
Glucose: 81 mg/dL (ref 70–99)
Potassium: 4.1 mmol/L (ref 3.5–5.2)
Sodium: 139 mmol/L (ref 134–144)
Total Protein: 6.6 g/dL (ref 6.0–8.5)
eGFR: 79 mL/min/{1.73_m2} (ref >59)

## 2023-07-07 LAB — MICROALBUMIN / CREATININE URINE RATIO
Creatinine, Urine: 30.9 mg/dL
Microalb/Creat Ratio: <10 mg/g{creat} (ref 0–29)
Microalbumin, Urine: <3 ug/mL

## 2023-07-07 LAB — CBC
Hematocrit: 47.3 % — ABNORMAL HIGH (ref 34.0–46.6)
Hemoglobin: 16.1 g/dL — ABNORMAL HIGH (ref 11.1–15.9)
MCH: 33.2 pg — ABNORMAL HIGH (ref 26.6–33.0)
MCHC: 34 g/dL (ref 31.5–35.7)
MCV: 98 fL — ABNORMAL HIGH (ref 79–97)
Platelets: 290 10*3/uL (ref 150–450)
RBC: 4.85 x10E6/uL (ref 3.77–5.28)
RDW: 13.4 % (ref 11.7–15.4)
WBC: 8.5 10*3/uL (ref 3.4–10.8)

## 2023-07-07 LAB — TSH: TSH: 4.03 u[IU]/mL (ref 0.450–4.500)

## 2023-07-07 LAB — BRAIN NATRIURETIC PEPTIDE: BNP: 37.1 pg/mL (ref 0.0–100.0)

## 2023-07-07 NOTE — Progress Notes (Signed)
Hi Skilar, metabolic panel overall looks good.  Hemoglobin still little elevated secondary to smoking..  Thyroid level is up just a little bit compared to prior.  It still technically in the normal range but I really want to keep a close eye on this and do a thyroid panel again in about 8 weeks.  BNP and the urine sample are still pending.  Just a reminder to get your mammogram scheduled and your lung cancer screening scheduled when you can this fall.  Thank you so much.

## 2023-07-07 NOTE — Progress Notes (Signed)
No heart failure which is great!!!

## 2023-07-12 ENCOUNTER — Telehealth: Payer: Self-pay | Admitting: Family Medicine

## 2023-07-12 NOTE — Telephone Encounter (Signed)
Patient called to inform you the swelling in her legs has not improved since her last visit.

## 2023-07-13 ENCOUNTER — Telehealth: Payer: Self-pay | Admitting: Family Medicine

## 2023-07-13 NOTE — Telephone Encounter (Signed)
Patient called in stating that her legs are still swollen and needs the next steps. Please Advise

## 2023-07-14 ENCOUNTER — Other Ambulatory Visit: Payer: Self-pay | Admitting: Family Medicine

## 2023-07-14 DIAGNOSIS — G2581 Restless legs syndrome: Secondary | ICD-10-CM

## 2023-07-26 ENCOUNTER — Telehealth: Payer: Self-pay | Admitting: Family Medicine

## 2023-07-26 NOTE — Telephone Encounter (Signed)
Prescription Request  07/26/2023  LOV: 07/06/2023  What is the name of the medication or equipment? methotrexate (RHEUMATREX) 2.5 MG tablet   Have you contacted your pharmacy to request a refill? Yes   Which pharmacy would you like this sent to?    [id number] [place], [place] - [address] [place] [address] Phone: [phone] Fax: [phone]     Patient notified that their request is being sent to the clinical staff for review and that they should receive a response within 2 business days.   Please advise at [facility] [phone]

## 2023-07-27 ENCOUNTER — Encounter: Payer: Self-pay | Admitting: Family Medicine

## 2023-07-27 ENCOUNTER — Ambulatory Visit (INDEPENDENT_AMBULATORY_CARE_PROVIDER_SITE_OTHER): Payer: Medicare Other | Admitting: Family Medicine

## 2023-07-27 VITALS — BP 128/80 | HR 83 | Ht 65.0 in | Wt 245.0 lb

## 2023-07-27 DIAGNOSIS — M069 Rheumatoid arthritis, unspecified: Secondary | ICD-10-CM

## 2023-07-27 DIAGNOSIS — R600 Localized edema: Secondary | ICD-10-CM | POA: Diagnosis not present

## 2023-07-27 DIAGNOSIS — Z23 Encounter for immunization: Secondary | ICD-10-CM

## 2023-07-27 MED ORDER — FUROSEMIDE 10 MG/ML IJ SOLN
20.0000 mg | Freq: Once | INTRAMUSCULAR | Status: AC
Start: 2023-07-27 — End: 2023-07-27
  Administered 2023-07-27: 20 mg via INTRAMUSCULAR

## 2023-07-27 MED ORDER — BUMETANIDE 1 MG PO TABS: 1.0000 mg | ORAL_TABLET | Freq: Two times a day (BID) | ORAL | 1 refills | Status: DC

## 2023-07-27 NOTE — Progress Notes (Signed)
   Established Patient Office Visit  Subjective   Patient ID: Michelle Rodgers, female    DOB: Nov 07, 1956  Age: 67 y.o. MRN: [id number]  No chief complaint on file.   HPI  When last here received lasix 80mg  IM for bilateral lower extremity swelling.  She said she lost 5 pounds after the injection and by the next morning she started taking the furosemide 40 mg twice a day instead of 20 mg twice a day and limit her fluids to no more than 48 ounces total but slowly started getting the 5 pounds back over about a 2 to 3-day.  She has had some cramping intermittently in her legs since upping the furosemide as well.  Says she really tries to stay away from salt so she has not really had any high intake of salt.    ROS    Objective:     BP 128/80   Pulse 83   Ht 5\' 5"  (1.651 m)   Wt 245 lb (111.1 kg)   SpO2 95%   BMI 40.77 kg/m    Physical Exam Vitals and nursing note reviewed.  Constitutional:      Appearance: Normal appearance.  HENT:     Head: Normocephalic and atraumatic.  Eyes:     Conjunctiva/sclera: Conjunctivae normal.  Cardiovascular:     Rate and Rhythm: Normal rate and regular rhythm.  Pulmonary:     Effort: Pulmonary effort is normal.     Breath sounds: Normal breath sounds.     Comments: Crackles at the bases bilaterally.  Skin:    General: Skin is warm and dry.  Neurological:     Mental Status: She is alert.  Psychiatric:        Mood and Affect: Mood normal.      No results found for any visits on 07/27/23.    The 10-year ASCVD risk score (Arnett DK, et al., 2019) is: 18.6%    Assessment & Plan:   Problem List Items Addressed This Visit   None Visit Diagnoses     Lower extremity edema    -  Primary   Relevant Medications   furosemide (LASIX) injection 20 mg (Completed)   furosemide (LASIX) injection 20 mg (Completed)   furosemide (LASIX) injection 20 mg (Completed)   furosemide (LASIX) injection 20 mg (Completed)   Other Relevant Orders    Basic Metabolic Panel (BMET)   Encounter for immunization       Relevant Orders   Flu Vaccine Trivalent High Dose (Fluad) (Completed)      Extremity swelling, bilat lower -given 80 mg of IM Lasix again today since that was effective last time.  And then switch to Bumex twice a day and see if that is more effective we will check a BMP today and I may have to add potassium to her regimen.  And then I will see her back in 1 week to make sure that she is doing well.  No follow-ups on file.    Nani Gasser, MD

## 2023-07-28 LAB — BASIC METABOLIC PANEL
BUN/Creatinine Ratio: 18 (ref 12–28)
BUN: 16 mg/dL (ref 8–27)
CO2: 27 mmol/L (ref 20–29)
Calcium: 9.7 mg/dL (ref 8.7–10.3)
Chloride: 90 mmol/L — ABNORMAL LOW (ref 96–106)
Creatinine, Ser: 0.88 mg/dL (ref 0.57–1.00)
Glucose: 136 mg/dL — ABNORMAL HIGH (ref 70–99)
Potassium: 4.2 mmol/L (ref 3.5–5.2)
Sodium: 136 mmol/L (ref 134–144)
eGFR: 72 mL/min/{1.73_m2} (ref >59)

## 2023-07-28 NOTE — Progress Notes (Signed)
Your lab work is within acceptable range and there are no concerning findings.   ?

## 2023-07-29 ENCOUNTER — Telehealth: Payer: Self-pay | Admitting: Family Medicine

## 2023-07-29 NOTE — Telephone Encounter (Signed)
Patient called stating the pharmacy has not received her refill of methotrexate (RHEUMATREX) 2.5 MG tablet [[id number]]   Pharmacy -  [phone] [place], [place] - [address]

## 2023-07-30 MED ORDER — METHOTREXATE SODIUM 2.5 MG PO TABS: 15.0000 mg | ORAL_TABLET | ORAL | 0 refills | Status: DC

## 2023-07-30 NOTE — Telephone Encounter (Signed)
Please send in Breztri refills to [employer] for pt's re-enrollment for 2025

## 2023-07-30 NOTE — Progress Notes (Signed)
Methotrexate ordered.  Rheumatology referral placed.  Please place order for DEXA and mammogram for downstairs.

## 2023-07-30 NOTE — Addendum Note (Signed)
Addended by: Nani Gasser D on: 07/30/2023 05:22 PM   Modules accepted: Orders

## 2023-07-30 NOTE — Telephone Encounter (Signed)
Pt was seen

## 2023-07-30 NOTE — Telephone Encounter (Signed)
Pt was seen

## 2023-07-30 NOTE — Telephone Encounter (Signed)
Meds sent today.

## 2023-07-30 NOTE — Progress Notes (Signed)
I do not mind bridging the medication but we do need to probably work on try to get her in with a new rheumatologist.  How many tabs that she take on Sunday?  Our notes say 1 but I do not think that that is accurate.  So we really need to get her mammogram updated especially if she is going to be on certain medications.  We do them here if that is convenient or if there is something closer to where she lives we are more than happy to help her get scheduled.

## 2023-08-02 ENCOUNTER — Other Ambulatory Visit: Payer: Self-pay

## 2023-08-02 DIAGNOSIS — Z13820 Encounter for screening for osteoporosis: Secondary | ICD-10-CM

## 2023-08-02 DIAGNOSIS — Z1231 Encounter for screening mammogram for malignant neoplasm of breast: Secondary | ICD-10-CM

## 2023-08-03 ENCOUNTER — Ambulatory Visit: Payer: Medicare Other | Admitting: Family Medicine

## 2023-08-10 ENCOUNTER — Other Ambulatory Visit: Payer: Self-pay | Admitting: Family Medicine

## 2023-08-19 ENCOUNTER — Ambulatory Visit: Payer: Medicare Other | Admitting: Family Medicine

## 2023-08-19 ENCOUNTER — Other Ambulatory Visit: Payer: Self-pay | Admitting: Family Medicine

## 2023-08-20 ENCOUNTER — Other Ambulatory Visit: Payer: Self-pay | Admitting: Family Medicine

## 2023-08-20 DIAGNOSIS — R0982 Postnasal drip: Secondary | ICD-10-CM

## 2023-08-27 ENCOUNTER — Telehealth: Payer: Self-pay | Admitting: Family Medicine

## 2023-08-27 MED ORDER — FOLIC ACID 1 MG PO TABS: 1.0000 mg | ORAL_TABLET | Freq: Every day | ORAL | 1 refills | Status: AC

## 2023-08-27 NOTE — Telephone Encounter (Signed)
Prescription Request  08/27/2023  LOV: 07/27/2023  What is the name of the medication or equipment? Gabapentin 300mg  and Folic Acid 1mg   Have you contacted your pharmacy to request a refill? Yes   Which pharmacy would you like this sent to?  [facility]   Phone number : [phone]  Patient notified that their request is being sent to the clinical staff for review and that they should receive a response within 2 business days.   Please advise at Mobile [phone] (mobile)

## 2023-08-27 NOTE — Telephone Encounter (Signed)
Gabapentin was sent 10/04. Will sent folic acid.

## 2023-09-12 ENCOUNTER — Other Ambulatory Visit: Payer: Self-pay | Admitting: Family Medicine

## 2023-09-15 ENCOUNTER — Ambulatory Visit: Payer: Medicare Other

## 2023-09-15 DIAGNOSIS — Z13820 Encounter for screening for osteoporosis: Secondary | ICD-10-CM

## 2023-09-15 DIAGNOSIS — E2839 Other primary ovarian failure: Secondary | ICD-10-CM | POA: Diagnosis not present

## 2023-09-15 DIAGNOSIS — Z1231 Encounter for screening mammogram for malignant neoplasm of breast: Secondary | ICD-10-CM | POA: Diagnosis not present

## 2023-09-15 DIAGNOSIS — Z780 Asymptomatic menopausal state: Secondary | ICD-10-CM | POA: Diagnosis not present

## 2023-09-15 NOTE — Progress Notes (Signed)
Hi [place], your bone density test shows a normal bone density test which is absolutely fantastic.  So recommend repeat bone density in about 5 years.  You would also qualify for lung cancer screening and since you are a smoker I think this would be really important it is basically like a low-dose CAT scan on your lungs that is done yearly for screening purposes if that something you are interested in we typically have one of our nurses from our pulmonology department give you a call and they can go over everything and get you scheduled.

## 2023-09-19 NOTE — Progress Notes (Signed)
Please call patient. Normal mammogram.  Repeat in 1 year.  

## 2023-09-20 ENCOUNTER — Other Ambulatory Visit: Payer: Self-pay | Admitting: Family Medicine

## 2023-09-20 DIAGNOSIS — Z720 Tobacco use: Secondary | ICD-10-CM

## 2023-09-20 DIAGNOSIS — F17200 Nicotine dependence, unspecified, uncomplicated: Secondary | ICD-10-CM

## 2023-09-20 NOTE — Progress Notes (Signed)
Orders Placed This Encounter  Procedures   Ambulatory Referral Lung Cancer Screening [facility]    Referral Priority:   Routine    Referral Type:   Consultation    Referral Reason:   Specialty Services Required    Number of Visits Requested:   1

## 2023-09-20 NOTE — Progress Notes (Signed)
Lung Screen order placed.

## 2023-09-24 ENCOUNTER — Other Ambulatory Visit: Payer: Self-pay | Admitting: Family Medicine

## 2023-09-24 DIAGNOSIS — G2581 Restless legs syndrome: Secondary | ICD-10-CM

## 2023-10-13 ENCOUNTER — Other Ambulatory Visit: Payer: Self-pay

## 2023-10-13 DIAGNOSIS — M1712 Unilateral primary osteoarthritis, left knee: Secondary | ICD-10-CM

## 2023-10-13 NOTE — Telephone Encounter (Signed)
Copied from CRM [id number]. Topic: Clinical - Medication Refill >> Oct 12, 2023  3:27 PM Amy B wrote: Most Recent Primary Care Visit:  Provider: Nani Gasser D  Department: [facility]  Visit Type: SAME DAY  Date: 07/27/2023  Medication: meloxicam (MOBIC) 7.5 MG tablet; methotrexate (RHEUMATREX) 2.5 MG tablet; clonazePAM (KLONOPIN) 0.5 MG tablet  Has the patient contacted their pharmacy? Yes (Agent: If no, request that the patient contact the pharmacy for the refill. If patient does not wish to contact the pharmacy document the reason why and proceed with request.) (Agent: If yes, when and what did the pharmacy advise?)  Is this the correct pharmacy for this prescription? Yes If no, delete pharmacy and type the correct one.  This is the patient's preferred pharmacy:   [phone] [place], [place] - [address] [facility] [address] [place] [address] Phone: [phone] Fax: [phone]   Has the prescription been filled recently? No  Is the patient out of the medication? No  Has the patient been seen for an appointment in the last year OR does the patient have an upcoming appointment? Yes  Can we respond through MyChart? Yes  Agent: Please be advised that Rx refills may take up to 3 business days. We ask that you follow-up with your pharmacy.

## 2023-10-17 MED ORDER — METHOTREXATE SODIUM 2.5 MG PO TABS: 15.0000 mg | ORAL_TABLET | ORAL | 0 refills | Status: AC

## 2023-10-17 MED ORDER — MELOXICAM 7.5 MG PO TABS: ORAL_TABLET | ORAL | 1 refills | Status: AC

## 2023-10-18 MED ORDER — CLONAZEPAM 0.5 MG PO TABS: 0.5000 mg | ORAL_TABLET | Freq: Every day | ORAL | 0 refills | Status: AC | PRN

## 2023-11-14 ENCOUNTER — Other Ambulatory Visit: Payer: Self-pay | Admitting: Family Medicine

## 2023-11-26 ENCOUNTER — Other Ambulatory Visit: Payer: Self-pay | Admitting: Family Medicine

## 2023-11-26 DIAGNOSIS — E118 Type 2 diabetes mellitus with unspecified complications: Secondary | ICD-10-CM

## 2023-12-10 ENCOUNTER — Telehealth: Payer: Self-pay

## 2023-12-10 MED ORDER — BREZTRI AEROSPHERE 160-9-4.8 MCG/ACT IN AERO: 2.0000 | INHALATION_SPRAY | Freq: Two times a day (BID) | RESPIRATORY_TRACT | 4 refills | Status: DC

## 2023-12-10 NOTE — Telephone Encounter (Signed)
PAP: Patient assistance application for Michelle Rodgers has been approved by PAP Companies: [employer] from 11/17/2023 to 11/15/2024. Medication should be delivered to PAP Delivery: Home. For further shipping updates, please contact [employer] ([employer]) at [phone]. Patient ID is: [id number]  PAP: Patient assistance application for [name]) has been mailed to pt's home address on file. Provider portion of application will be faxed to provider's office.

## 2023-12-10 NOTE — Telephone Encounter (Signed)
-----   Message from Montel Culver sent at 12/10/2023 12:34 PM EST ----- Hey Dr. Linford Arnold   Can we get an updated prescription for [facility] sent in to [employer] for [employer] patient assistance.  She is approved they just need a new prescription sent in.  It is listed in her pharmacies.  Thank you!

## 2023-12-10 NOTE — Telephone Encounter (Signed)
Meds ordered this encounter  Medications   Budeson-Glycopyrrol-Formoterol (BREZTRI AEROSPHERE) 160-9-4.8 MCG/ACT AERO    Sig: Inhale 2 puffs into the lungs 2 (two) times daily.    Dispense:  32.1 g    Refill:  4

## 2024-01-06 ENCOUNTER — Other Ambulatory Visit: Payer: Self-pay | Admitting: *Deleted

## 2024-01-13 MED ORDER — BREZTRI AEROSPHERE 160-9-4.8 MCG/ACT IN AERO: 2.0000 | INHALATION_SPRAY | Freq: Two times a day (BID) | RESPIRATORY_TRACT | 4 refills | Status: AC

## 2024-01-13 NOTE — Telephone Encounter (Signed)
 Left HIPAA complaint voice mail for patient regarding 2025 patient assistance applications for synjardy.  Application were mailed 1/29

## 2024-02-02 ENCOUNTER — Other Ambulatory Visit: Payer: Self-pay | Admitting: *Deleted

## 2024-02-02 DIAGNOSIS — E118 Type 2 diabetes mellitus with unspecified complications: Secondary | ICD-10-CM

## 2024-02-02 MED ORDER — ACCU-CHEK SOFTCLIX LANCETS MISC: 11 refills | Status: AC

## 2024-02-25 ENCOUNTER — Other Ambulatory Visit: Payer: Self-pay | Admitting: *Deleted

## 2024-02-25 MED ORDER — BUMETANIDE 1 MG PO TABS: 1.0000 mg | ORAL_TABLET | Freq: Two times a day (BID) | ORAL | 1 refills | Status: DC

## 2024-05-16 ENCOUNTER — Encounter: Payer: Self-pay | Admitting: Family Medicine

## 2024-05-16 ENCOUNTER — Ambulatory Visit: Admitting: Family Medicine

## 2024-05-23 ENCOUNTER — Other Ambulatory Visit: Payer: Self-pay | Admitting: *Deleted

## 2024-05-23 ENCOUNTER — Telehealth: Payer: Self-pay | Admitting: *Deleted

## 2024-05-23 MED ORDER — ATORVASTATIN CALCIUM 20 MG PO TABS: 20.0000 mg | ORAL_TABLET | Freq: Every day | ORAL | 3 refills | Status: AC

## 2024-05-23 NOTE — Telephone Encounter (Signed)
-----   Message from Glenvil V sent at 05/22/2024 10:31 AM EDT ----- Here's a Rx refill request for Atorvastatin  20 mg tablet.

## 2024-05-23 NOTE — Telephone Encounter (Signed)
 Please call pt. She is overdue for follow up for BP/DM and labs with Dr. Alvan. Also looks like she is being seen somewhere else. If so we should remove Dr. Alvan as pcp

## 2024-07-18 ENCOUNTER — Encounter: Payer: Self-pay | Admitting: Sports Medicine

## 2024-07-21 NOTE — Telephone Encounter (Signed)
 Can someone call pt to see if she is still a pt here or has she established care somewhere else. If she is no longer a pt here please remove Dr. Alvan as pcp. thanks

## 2024-08-17 ENCOUNTER — Other Ambulatory Visit: Payer: Self-pay | Admitting: *Deleted

## 2024-08-17 MED ORDER — BUMETANIDE 1 MG PO TABS: 1.0000 mg | ORAL_TABLET | Freq: Two times a day (BID) | ORAL | 1 refills | Status: AC
